# Patient Record
Sex: Male | Born: 1960 | Hispanic: No | Marital: Married | Smoking: Former smoker
Health system: Southern US, Community
[De-identification: ages and names within clinical notes are randomized; demographics above are authoritative.]

## PROBLEM LIST (undated history)

## (undated) DIAGNOSIS — R1310 Dysphagia, unspecified: Secondary | ICD-10-CM

## (undated) DIAGNOSIS — K219 Gastro-esophageal reflux disease without esophagitis: Secondary | ICD-10-CM

## (undated) DIAGNOSIS — T7840XA Allergy, unspecified, initial encounter: Secondary | ICD-10-CM

## (undated) DIAGNOSIS — D751 Secondary polycythemia: Secondary | ICD-10-CM

## (undated) DIAGNOSIS — Z9889 Other specified postprocedural states: Secondary | ICD-10-CM

## (undated) DIAGNOSIS — R112 Nausea with vomiting, unspecified: Secondary | ICD-10-CM

## (undated) DIAGNOSIS — E785 Hyperlipidemia, unspecified: Secondary | ICD-10-CM

## (undated) HISTORY — PX: NOSE SURGERY: SHX723

## (undated) HISTORY — PX: UPPER GASTROINTESTINAL ENDOSCOPY: SHX188

## (undated) HISTORY — DX: Allergy, unspecified, initial encounter: T78.40XA

## (undated) HISTORY — DX: Secondary polycythemia: D75.1

## (undated) HISTORY — PX: TONSILLECTOMY: SUR1361

## (undated) HISTORY — PX: ESOPHAGOGASTRODUODENOSCOPY: SHX1529

## (undated) HISTORY — DX: Hyperlipidemia, unspecified: E78.5

---

## 2006-06-11 ENCOUNTER — Ambulatory Visit (HOSPITAL_BASED_OUTPATIENT_CLINIC_OR_DEPARTMENT_OTHER): Admission: RE | Admit: 2006-06-11 | Discharge: 2006-06-11 | Payer: Self-pay | Admitting: Urology

## 2013-05-25 ENCOUNTER — Other Ambulatory Visit: Payer: Self-pay | Admitting: Internal Medicine

## 2013-05-25 DIAGNOSIS — R1310 Dysphagia, unspecified: Secondary | ICD-10-CM

## 2013-05-30 ENCOUNTER — Inpatient Hospital Stay: Admission: RE | Admit: 2013-05-30 | Payer: Self-pay | Source: Ambulatory Visit

## 2013-06-20 ENCOUNTER — Other Ambulatory Visit (HOSPITAL_COMMUNITY): Payer: Self-pay

## 2013-06-27 ENCOUNTER — Ambulatory Visit
Admission: RE | Admit: 2013-06-27 | Discharge: 2013-06-27 | Disposition: A | Discharge status: Home / Self Care | Payer: BC Managed Care – PPO | Source: Ambulatory Visit | Attending: Internal Medicine | Admitting: Internal Medicine

## 2013-06-27 DIAGNOSIS — R1310 Dysphagia, unspecified: Secondary | ICD-10-CM

## 2013-06-28 ENCOUNTER — Encounter: Payer: Self-pay | Admitting: Gastroenterology

## 2013-08-11 ENCOUNTER — Ambulatory Visit (INDEPENDENT_AMBULATORY_CARE_PROVIDER_SITE_OTHER): Payer: BC Managed Care – PPO | Admitting: Gastroenterology

## 2013-08-11 ENCOUNTER — Encounter: Payer: Self-pay | Admitting: Gastroenterology

## 2013-08-11 VITALS — BP 116/90 | HR 72 | Ht 70.75 in | Wt 210.0 lb

## 2013-08-11 DIAGNOSIS — K222 Esophageal obstruction: Secondary | ICD-10-CM

## 2013-08-11 DIAGNOSIS — Z1211 Encounter for screening for malignant neoplasm of colon: Secondary | ICD-10-CM

## 2013-08-11 MED ORDER — PANTOPRAZOLE SODIUM 40 MG PO TBEC: 40.0000 mg | DELAYED_RELEASE_TABLET | Freq: Every day | ORAL | Status: DC

## 2013-08-11 NOTE — Addendum Note (Signed)
Addended by: Oda Kilts on: 08/11/2013 11:38 AM   Modules accepted: Orders

## 2013-08-11 NOTE — Assessment & Plan Note (Signed)
Patient has a symptomatic stricture as evidenced by barium esophagram.  Recommendations #1 begin Protonix 40 mg daily #2 upper endoscopy with dilation as indicated  Risks, alternatives, and complications of the procedure, including bleeding, perforation, and possible need for surgery, were explained to the patient.  Patient's questions were answered.

## 2013-08-11 NOTE — Assessment & Plan Note (Signed)
She was encouraged to undergo screening colonoscopy

## 2013-08-11 NOTE — Progress Notes (Signed)
    _                                                                                                                History of Present Illness: Less than 53 year old white male referred for evaluation of dysphagia.  Patient has developed dysphagia to solids intermittently.  Recent barium esophagram demonstrated some narrowing at the GE junction and holdup of a barium pill.  He has occasional pyrosis.  Weight has been stable.    History reviewed. No pertinent past medical history. Past Surgical History  Procedure Laterality Date  . Nose surgery    . Tonsillectomy     family history is not on file. He was adopted. Current Outpatient Prescriptions  Medication Sig Dispense Refill  . Calcium Carbonate Antacid (ANTACID PO) Take by mouth as needed.       No current facility-administered medications for this visit.   Allergies as of 08/11/2013 - Review Complete 08/11/2013  Allergen Reaction Noted  . Codeine Other (See Comments) 08/11/2013    reports that he quit smoking about 8 years ago. His smoking use included Cigarettes. He smoked 0.00 packs per day. He quit smokeless tobacco use about 30 years ago. His smokeless tobacco use included Chew. He reports that he drinks alcohol. He reports that he does not use illicit drugs.     Review of Systems: Pertinent positive and negative review of systems were noted in the above HPI section. All other review of systems were otherwise negative.  Vital signs were reviewed in today's medical record Physical Exam: General: Well developed , well nourished, no acute distress Skin: anicteric Head: Normocephalic and atraumatic Eyes:  sclerae anicteric, EOMI Ears: Normal auditory acuity Mouth: No deformity or lesions Neck: Supple, no masses or thyromegaly Lungs: Clear throughout to auscultation Heart: Regular rate and rhythm; no murmurs, rubs or bruits Abdomen: Soft, non tender and non distended. No masses, hepatosplenomegaly or  hernias noted. Normal Bowel sounds Rectal:deferred Musculoskeletal: Symmetrical with no gross deformities  Skin: No lesions on visible extremities Pulses:  Normal pulses noted Extremities: No clubbing, cyanosis, edema or deformities noted Neurological: Alert oriented x 4, grossly nonfocal Cervical Nodes:  No significant cervical adenopathy Inguinal Nodes: No significant inguinal adenopathy Psychological:  Alert and cooperative. Normal mood and affect  See Assessment and Plan under Problem List

## 2013-08-11 NOTE — Patient Instructions (Signed)

## 2013-08-14 ENCOUNTER — Encounter: Payer: Self-pay | Admitting: Gastroenterology

## 2013-08-21 ENCOUNTER — Ambulatory Visit (AMBULATORY_SURGERY_CENTER): Payer: BC Managed Care – PPO | Admitting: Gastroenterology

## 2013-08-21 ENCOUNTER — Encounter: Payer: Self-pay | Admitting: Gastroenterology

## 2013-08-21 VITALS — BP 105/70 | HR 64 | Temp 97.1°F | Resp 11 | Ht 70.0 in | Wt 210.0 lb

## 2013-08-21 DIAGNOSIS — D131 Benign neoplasm of stomach: Secondary | ICD-10-CM

## 2013-08-21 DIAGNOSIS — K222 Esophageal obstruction: Secondary | ICD-10-CM

## 2013-08-21 MED ORDER — SODIUM CHLORIDE 0.9 % IV SOLN: 500.0000 mL | INTRAVENOUS | Status: DC

## 2013-08-21 NOTE — Progress Notes (Signed)
Report to pacu rn, vss, bbs=clear 

## 2013-08-21 NOTE — Progress Notes (Signed)
Patient denies any allergies to eggs or soy. Patient has nausea after surgery. Patient denies any oxygen use at home and does not take any diet/weight loss medications.

## 2013-08-21 NOTE — Progress Notes (Signed)
Per Dr. Deatra Ina he stated that he wanted the EGD in 2 weeks scheduled at  the [facility].

## 2013-08-21 NOTE — Patient Instructions (Signed)
YOU HAD AN ENDOSCOPIC PROCEDURE TODAY AT [facility]: Refer to the procedure report that was given to you for any specific questions about what was found during the examination.  If the procedure report does not answer your questions, please call your gastroenterologist to clarify.  If you requested that your care partner not be given the details of your procedure findings, then the procedure report has been included in a sealed envelope for you to review at your convenience later.  YOU SHOULD EXPECT: Some feelings of bloating in the abdomen. Passage of more gas than usual.  Walking can help get rid of the air that was put into your GI tract during the procedure and reduce the bloating. If you had a lower endoscopy (such as a colonoscopy or flexible sigmoidoscopy) you may notice spotting of blood in your stool or on the toilet paper. If you underwent a bowel prep for your procedure, then you may not have a normal bowel movement for a few days.  DIET: FOLLOW DILATION HANDOUT.  ACTIVITY: Your care partner should take you home directly after the procedure.  You should plan to take it easy, moving slowly for the rest of the day.  You can resume normal activity the day after the procedure however you should NOT DRIVE or use heavy machinery for 24 hours (because of the sedation medicines used during the test).    SYMPTOMS TO REPORT IMMEDIATELY: A gastroenterologist can be reached at any hour.  During normal business hours, 8:30 AM to 5:00 PM Monday through Friday, call [phone].  After hours and on weekends, please call the GI answering service at [phone] who will take a message and have the physician on call contact you.    Following upper endoscopy (EGD)  Vomiting of blood or coffee ground material  New chest pain or pain under the shoulder blades  Painful or persistently difficult swallowing  New shortness of breath  Fever of 100F or higher  Black, tarry-looking  stools  FOLLOW UP: If any biopsies were taken you will be contacted by phone or by letter within the next 1-3 weeks.  Call your gastroenterologist if you have not heard about the biopsies in 3 weeks.  Our staff will call the home number listed on your records the next business day following your procedure to check on you and address any questions or concerns that you may have at that time regarding the information given to you following your procedure. This is a courtesy call and so if there is no answer at the home number and we have not heard from you through the emergency physician on call, we will assume that you have returned to your regular daily activities without incident.  SIGNATURES/CONFIDENTIALITY: You and/or your care partner have signed paperwork which will be entered into your electronic medical record.  These signatures attest to the fact that that the information above on your After Visit Summary has been reviewed and is understood.  Full responsibility of the confidentiality of this discharge information lies with you and/or your care-partner.   Information given stricture and dilation diet with discharge instructions.

## 2013-08-22 ENCOUNTER — Telehealth: Payer: Self-pay

## 2013-08-22 ENCOUNTER — Other Ambulatory Visit: Payer: Self-pay

## 2013-08-22 DIAGNOSIS — R1319 Other dysphagia: Secondary | ICD-10-CM

## 2013-08-22 NOTE — Telephone Encounter (Signed)
Message copied by Algernon Huxley on Tue Aug 22, 2013 11:17 AM ------      Message from: Erskine Emery D      Created: Tue Aug 22, 2013 10:09 AM      Regarding: RE: EGD with balloon dil       Can schedule this sometime during my hospital week in early June      ----- Message -----         From: Maury Dus, RN         Sent: 08/22/2013   9:51 AM           To: Inda Castle, MD      Subject: EGD with balloon dil                                     Dr. Deatra Ina,            You wanted this pt to have an EGD with balloon dil in 2 weeks. The earliest this can be scheduled at [facility] is 10-05-13 if you can do it. You already have a 12:30 EGD with banding scheduled but you can follow that procedure if you want. If this date doesn't work the next available would be 10/16/13.            Can you do another procedure 10/05/13?            Thanks,      Vaughan Basta       ------

## 2013-08-22 NOTE — Telephone Encounter (Signed)
Pt scheduled for EGD with balloon dil/MAC at [facility] 09/19/13. Pt to arrive at [facility] at 11am for a 12:30pm appt.  Instructions mailed to pt and he is aware of appt date and time.

## 2013-08-22 NOTE — Telephone Encounter (Signed)
No answer, left vm 

## 2013-08-28 ENCOUNTER — Encounter (HOSPITAL_COMMUNITY): Payer: Self-pay | Admitting: Pharmacy Technician

## 2013-09-06 ENCOUNTER — Encounter (HOSPITAL_COMMUNITY): Payer: Self-pay | Admitting: *Deleted

## 2013-09-18 ENCOUNTER — Telehealth: Payer: Self-pay | Admitting: Gastroenterology

## 2013-09-18 NOTE — Telephone Encounter (Signed)
Pt states he had a cold last week and that his throat felt stuffy. He lost his voice and it just started coming back. Pt states he has not had a fever but wants to make sure he is ok to proceed with EGD. Please advise.

## 2013-09-18 NOTE — Telephone Encounter (Signed)
Spoke with pt and he is aware. 

## 2013-09-18 NOTE — Telephone Encounter (Signed)
Okay to proceed with procedure

## 2013-09-19 ENCOUNTER — Encounter (HOSPITAL_COMMUNITY): Payer: BC Managed Care – PPO | Admitting: Anesthesiology

## 2013-09-19 ENCOUNTER — Encounter (HOSPITAL_COMMUNITY)
Admission: RE | Disposition: A | Discharge status: Home / Self Care | Payer: Self-pay | Source: Ambulatory Visit | Attending: Gastroenterology

## 2013-09-19 ENCOUNTER — Encounter (HOSPITAL_COMMUNITY): Payer: Self-pay | Admitting: Gastroenterology

## 2013-09-19 ENCOUNTER — Ambulatory Visit (HOSPITAL_COMMUNITY): Payer: BC Managed Care – PPO | Admitting: Anesthesiology

## 2013-09-19 ENCOUNTER — Ambulatory Visit (HOSPITAL_COMMUNITY)
Admission: RE | Admit: 2013-09-19 | Discharge: 2013-09-19 | Disposition: A | Discharge status: Home / Self Care | Payer: BC Managed Care – PPO | Source: Ambulatory Visit | Attending: Gastroenterology | Admitting: Gastroenterology

## 2013-09-19 DIAGNOSIS — R1319 Other dysphagia: Secondary | ICD-10-CM

## 2013-09-19 DIAGNOSIS — K219 Gastro-esophageal reflux disease without esophagitis: Secondary | ICD-10-CM | POA: Insufficient documentation

## 2013-09-19 DIAGNOSIS — Z885 Allergy status to narcotic agent status: Secondary | ICD-10-CM | POA: Insufficient documentation

## 2013-09-19 DIAGNOSIS — D131 Benign neoplasm of stomach: Secondary | ICD-10-CM | POA: Insufficient documentation

## 2013-09-19 DIAGNOSIS — Z87891 Personal history of nicotine dependence: Secondary | ICD-10-CM | POA: Insufficient documentation

## 2013-09-19 DIAGNOSIS — K222 Esophageal obstruction: Secondary | ICD-10-CM | POA: Insufficient documentation

## 2013-09-19 HISTORY — PX: BALLOON DILATION: SHX5330

## 2013-09-19 HISTORY — DX: Other specified postprocedural states: Z98.890

## 2013-09-19 HISTORY — DX: Dysphagia, unspecified: R13.10

## 2013-09-19 HISTORY — DX: Gastro-esophageal reflux disease without esophagitis: K21.9

## 2013-09-19 HISTORY — DX: Other specified postprocedural states: R11.2

## 2013-09-19 HISTORY — PX: ESOPHAGOGASTRODUODENOSCOPY: SHX5428

## 2013-09-19 SURGERY — EGD (ESOPHAGOGASTRODUODENOSCOPY)
Anesthesia: Monitor Anesthesia Care

## 2013-09-19 MED ORDER — BUTAMBEN-TETRACAINE-BENZOCAINE 2-2-14 % EX AERO
INHALATION_SPRAY | CUTANEOUS | Status: DC | PRN
  Administered 2013-09-19: 2 via TOPICAL

## 2013-09-19 MED ORDER — SODIUM CHLORIDE 0.9 % IV SOLN: INTRAVENOUS | Status: DC

## 2013-09-19 MED ORDER — FENTANYL CITRATE 0.05 MG/ML IJ SOLN
INTRAMUSCULAR | Status: AC
  Filled 2013-09-19: qty 2

## 2013-09-19 MED ORDER — MIDAZOLAM HCL 5 MG/5ML IJ SOLN
INTRAMUSCULAR | Status: DC | PRN
  Administered 2013-09-19: 1 mg via INTRAVENOUS

## 2013-09-19 MED ORDER — PROPOFOL 10 MG/ML IV BOLUS
INTRAVENOUS | Status: AC
  Filled 2013-09-19: qty 20

## 2013-09-19 MED ORDER — KETAMINE HCL 10 MG/ML IJ SOLN
INTRAMUSCULAR | Status: AC
  Filled 2013-09-19: qty 1

## 2013-09-19 MED ORDER — LACTATED RINGERS IV SOLN
INTRAVENOUS | Status: DC
  Administered 2013-09-19: 1000 mL via INTRAVENOUS

## 2013-09-19 MED ORDER — FENTANYL CITRATE 0.05 MG/ML IJ SOLN
INTRAMUSCULAR | Status: DC | PRN
  Administered 2013-09-19: 50 ug via INTRAVENOUS

## 2013-09-19 MED ORDER — MIDAZOLAM HCL 2 MG/2ML IJ SOLN
INTRAMUSCULAR | Status: AC
  Filled 2013-09-19: qty 2

## 2013-09-19 MED ORDER — PROPOFOL INFUSION 10 MG/ML OPTIME
INTRAVENOUS | Status: DC | PRN
  Administered 2013-09-19: 250 ug/kg/min via INTRAVENOUS

## 2013-09-19 NOTE — Discharge Instructions (Signed)
Monitored Anesthesia Care  Monitored anesthesia care is an anesthesia service for a medical procedure. Anesthesia is the loss of the ability to feel pain. It is produced by medications called anesthetics. It may affect a small area of your body (local anesthesia), a large area of your body (regional anesthesia), or your entire body (general anesthesia). The need for monitored anesthesia care depends your procedure, your condition, and the potential need for regional or general anesthesia. It is often provided during procedures where:   General anesthesia may be needed if there are complications. This is because you need special care when you are under general anesthesia.   You will be under local or regional anesthesia. This is so that you are able to have higher levels of anesthesia if needed.   You will receive calming medications (sedatives). This is especially the case if sedatives are given to put you in a semi-conscious state of relaxation (deep sedation). This is because the amount of sedative needed to produce this state can be hard to predict. Too much of a sedative can produce general anesthesia. Monitored anesthesia care is performed by one or more caregivers who have special training in all types of anesthesia. You will need to meet with these caregivers before your procedure. During this meeting, they will ask you about your medical history. They will also give you instructions to follow. (For example, you will need to stop eating and drinking before your procedure. You may also need to stop or change medications you are taking.) During your procedure, your caregivers will stay with you. They will:   Watch your condition. This includes watching you blood pressure, breathing, and level of pain.   Diagnose and treat problems that occur.   Give medications if they are needed. These may include calming medications (sedatives) and anesthetics.   Make sure you are comfortable.  Having  monitored anesthesia care does not necessarily mean that you will be under anesthesia. It does mean that your caregivers will be able to manage anesthesia if you need it or if it occurs. It also means that you will be able to have a different type of anesthesia than you are having if you need it. When your procedure is complete, your caregivers will continue to watch your condition. They will make sure any medications wear off before you are allowed to go home.  Document Released: 12/24/2004 Document Revised: 07/25/2012 Document Reviewed: 05/11/2012 [facility] Patient Information 2014 [place], [place]. Colonoscopy A colonoscopy is an exam to look at the entire large intestine (colon). This exam can help find problems such as tumors, polyps, inflammation, and areas of bleeding. The exam takes about 1 hour.  LET [facility] CARE PROVIDER KNOW ABOUT:   Any allergies you have.  All medicines you are taking, including vitamins, herbs, eye drops, creams, and over-the-counter medicines.  Previous problems you or members of your family have had with the use of anesthetics.  Any blood disorders you have.  Previous surgeries you have had.  Medical conditions you have. RISKS AND COMPLICATIONS  Generally, this is a safe procedure. However, as with any procedure, complications can occur. Possible complications include:  Bleeding.  Tearing or rupture of the colon Winferd Wease.  Reaction to medicines given during the exam.  Infection (rare). BEFORE THE PROCEDURE   Ask your health care provider about changing or stopping your regular medicines.  You may be prescribed an oral bowel prep. This involves drinking a large amount of medicated liquid, starting the day before  your procedure. The liquid will cause you to have multiple loose stools until your stool is almost clear or light green. This cleans out your colon in preparation for the procedure.  Do not eat or drink anything else once you have started the  bowel prep, unless your health care provider tells you it is safe to do so.  Arrange for someone to drive you home after the procedure. PROCEDURE   You will be given medicine to help you relax (sedative).  You will lie on your side with your knees bent.  A long, flexible tube with a light and camera on the end (colonoscope) will be inserted through the rectum and into the colon. The camera sends video back to a computer screen as it moves through the colon. The colonoscope also releases carbon dioxide gas to inflate the colon. This helps your health care provider see the area better.  During the exam, your health care provider may take a small tissue sample (biopsy) to be examined under a microscope if any abnormalities are found.  The exam is finished when the entire colon has been viewed. AFTER THE PROCEDURE   Do not drive for 24 hours after the exam.  You may have a small amount of blood in your stool.  You may pass moderate amounts of gas and have mild abdominal cramping or bloating. This is caused by the gas used to inflate your colon during the exam.  Ask when your test results will be ready and how you will get your results. Make sure you get your test results. Document Released: 03/27/2000 Document Revised: 01/18/2013 Document Reviewed: 12/05/2012 [facility] Patient Information 2014 [place].

## 2013-09-19 NOTE — Anesthesia Preprocedure Evaluation (Addendum)
Anesthesia Evaluation  Patient identified by MRN, date of birth, ID band Patient awake    Reviewed: Allergy & Precautions, H&P , NPO status , Patient's Chart, lab work & pertinent test results  History of Anesthesia Complications (+) PONV  Airway Mallampati: II TM Distance: >3 FB Neck ROM: full    Dental no notable dental hx. (+) Teeth Intact, Dental Advisory Given   Pulmonary neg pulmonary ROS, former smoker,  breath sounds clear to auscultation  Pulmonary exam normal       Cardiovascular Exercise Tolerance: Good negative cardio ROS  Rhythm:regular Rate:Normal     Neuro/Psych negative neurological ROS  negative psych ROS   GI/Hepatic negative GI ROS, Neg liver ROS, GERD-  Medicated and Controlled,dysphagia   Endo/Other  negative endocrine ROS  Renal/GU negative Renal ROS  negative genitourinary   Musculoskeletal   Abdominal   Peds  Hematology negative hematology ROS (+)   Anesthesia Other Findings   Reproductive/Obstetrics negative OB ROS                          Anesthesia Physical Anesthesia Plan  ASA: II  Anesthesia Plan: MAC   Post-op Pain Management:    Induction:   Airway Management Planned:   Additional Equipment:   Intra-op Plan:   Post-operative Plan:   Informed Consent: I have reviewed the patients History and Physical, chart, labs and discussed the procedure including the risks, benefits and alternatives for the proposed anesthesia with the patient or authorized representative who has indicated his/her understanding and acceptance.   Dental Advisory Given  Plan Discussed with: CRNA and Surgeon  Anesthesia Plan Comments:         Anesthesia Quick Evaluation

## 2013-09-19 NOTE — H&P (Signed)
_                                                                                                                    History of Present Illness:  53 year old white male referred for evaluation of dysphagia.  Patient has developed dysphagia to solids intermittently.  Recent barium esophagram demonstrated some narrowing at the GE junction and holdup of a barium pill.  He has occasional pyrosis.  Weight has been stable.       History reviewed. No pertinent past medical history. Past Surgical History   Procedure  Laterality  Date   .  Nose surgery       .  Tonsillectomy          family history is not on file. He was adopted. Current Outpatient Prescriptions   Medication  Sig  Dispense  Refill   .  Calcium Carbonate Antacid (ANTACID PO)  Take by mouth as needed.             No current facility-administered medications for this visit.       Allergies as of 08/11/2013 - Review Complete 08/11/2013   Allergen  Reaction  Noted   .  Codeine  Other (See Comments)  08/11/2013       reports that he quit smoking about 8 years ago. His smoking use included Cigarettes. He smoked 0.00 packs per day. He quit smokeless tobacco use about 30 years ago. His smokeless tobacco use included Chew. He reports that he drinks alcohol. He reports that he does not use illicit drugs.         Review of Systems: Pertinent positive and negative review of systems were noted in the above HPI section. All other review of systems were otherwise negative.   Vital signs were reviewed in today's medical record Physical Exam: General: Well developed , well nourished, no acute distress Skin: anicteric Head: Normocephalic and atraumatic Eyes:  sclerae anicteric, EOMI Ears: Normal auditory acuity Mouth: No deformity or lesions Neck: Supple, no masses or thyromegaly Lungs: Clear throughout to auscultation Heart: Regular rate and rhythm; no murmurs, rubs or bruits Abdomen: Soft,  non tender and non distended. No masses, hepatosplenomegaly or hernias noted. Normal Bowel sounds Rectal:deferred Musculoskeletal: Symmetrical with no gross deformities   Skin: No lesions on visible extremities Pulses:  Normal pulses noted Extremities: No clubbing, cyanosis, edema or deformities noted Neurological: Alert oriented x 4, grossly nonfocal Cervical Nodes:  No significant cervical adenopathy Inguinal Nodes: No significant inguinal adenopathy Psychological:  Alert and cooperative. Normal mood and affect   See Assessment and Plan under Problem List                      Stricture and stenosis of esophagus - Inda Castle, MD at 08/11/2013 11:32 AM      Status: Written Related Problem: Stricture and stenosis of esophagus  Patient has a symptomatic stricture as evidenced by barium esophagram.   Recommendations #1 begin Protonix 40 mg daily #2 upper endoscopy with dilation as indicated   Risks, alternatives, and complications of the procedure, including bleeding, perforation, and possible need for surgery, were explained to the patient.  Patient's questions were answered.

## 2013-09-19 NOTE — Anesthesia Postprocedure Evaluation (Signed)
  Anesthesia Post-op Note  Patient: Leonard Caldwell  Procedure(s) Performed: Procedure(s) (LRB): ESOPHAGOGASTRODUODENOSCOPY (EGD) (N/A) BALLOON DILATION (N/A)  Patient Location: PACU  Anesthesia Type: MAC  Level of Consciousness: awake and alert   Airway and Oxygen Therapy: Patient Spontanous Breathing  Post-op Pain: mild  Post-op Assessment: Post-op Vital signs reviewed, Patient's Cardiovascular Status Stable, Respiratory Function Stable, Patent Airway and No signs of Nausea or vomiting  Last Vitals:  Filed Vitals:   09/19/13 1227  BP: 106/63  Pulse:   Temp:   Resp: 16    Post-op Vital Signs: stable   Complications: No apparent anesthesia complications

## 2013-09-19 NOTE — Op Note (Signed)
[facility] [place], [address]   ENDOSCOPY PROCEDURE REPORT  PATIENT: Leonard Caldwell, Leonard Caldwell  MR#: [id number] BIRTHDATE: 08-06-1960 , 52  yrs. old GENDER: Male ENDOSCOPIST: Inda Castle, MD ASSISTANT:   Sharon Mt, Endo Technician Hilma Favors, RN REFERRED ME:QASTMH Philip Aspen, M.D. PROCEDURE DATE:  09/19/2013 PROCEDURE:   EGD with balloon dilatation ASA CLASS:   Class II INDICATIONS:dysphagia.   barium swallow demonstrated an early distal esophageal stricture MEDICATIONS: MAC sedation, administered by CRNA TOPICAL ANESTHETIC:  DESCRIPTION OF PROCEDURE:   After the risks benefits and alternatives of the procedure were thoroughly explained, informed consent was obtained.  The     endoscope was introduced through the mouth  and advanced to the second portion of the duodenum , The instrument was slowly withdrawn as the mucosa was carefully examined.    At the GE junction there was very slight narrowing.  The 9 mm gastroscope easily traversed the stricture.  In the gastric fundus there was a 2 mm sessile polyp with normal appearing mucosa consistent with a gastric fundic polyp The remainder of the upper endoscopy exam was otherwise normal.     Dilation was then performed at the gastroesphageal junction  Dilator:Balloon Size:15-16.5-18mm  Reststance:moderate to the 18 mm dilator.  Heme:none  COMPLICATIONS: There were no complications. ENDOSCOPIC IMPRESSION: 1.   Early esophageal stricture-status post balloon dilation 2.  fundic gland polyp  RECOMMENDATIONS: repeat dilatation as needed  eSigned:  Inda Castle, MD 09/19/2013 12:09 PM  CC:

## 2013-09-19 NOTE — Transfer of Care (Signed)
Immediate Anesthesia Transfer of Care Note  Patient: Leonard Caldwell  Procedure(s) Performed: Procedure(s): ESOPHAGOGASTRODUODENOSCOPY (EGD) (N/A) BALLOON DILATION (N/A)  Patient Location: PACU  Anesthesia Type:MAC  Level of Consciousness: awake, alert , oriented and patient cooperative  Airway & Oxygen Therapy: Patient Spontanous Breathing and Patient connected to nasal cannula oxygen  Post-op Assessment: Report given to PACU RN, Post -op Vital signs reviewed and stable and Patient moving all extremities X 4  Post vital signs: stable  Complications: No apparent anesthesia complications

## 2013-09-20 ENCOUNTER — Encounter (HOSPITAL_COMMUNITY): Payer: Self-pay | Admitting: Gastroenterology

## 2014-05-28 ENCOUNTER — Encounter: Payer: Self-pay | Admitting: Gastroenterology

## 2014-07-16 ENCOUNTER — Ambulatory Visit (AMBULATORY_SURGERY_CENTER): Payer: Self-pay | Admitting: *Deleted

## 2014-07-16 VITALS — Ht 72.0 in | Wt 216.4 lb

## 2014-07-16 DIAGNOSIS — Z1211 Encounter for screening for malignant neoplasm of colon: Secondary | ICD-10-CM

## 2014-07-16 MED ORDER — NA SULFATE-K SULFATE-MG SULF 17.5-3.13-1.6 GM/177ML PO SOLN: 1.0000 | Freq: Once | ORAL | Status: DC

## 2014-07-16 NOTE — Progress Notes (Signed)
No egg or soy allergy Propofol last year was no issues  No diet pills No home 02 use  Post op N/V but no other issues  emmi declined

## 2014-07-27 ENCOUNTER — Encounter: Payer: Self-pay | Admitting: Gastroenterology

## 2014-07-30 ENCOUNTER — Encounter: Payer: Self-pay | Admitting: Gastroenterology

## 2014-07-30 ENCOUNTER — Ambulatory Visit (AMBULATORY_SURGERY_CENTER): Payer: BLUE CROSS/BLUE SHIELD | Admitting: Gastroenterology

## 2014-07-30 VITALS — BP 109/70 | HR 79 | Temp 98.4°F | Resp 19 | Ht 72.0 in | Wt 216.0 lb

## 2014-07-30 DIAGNOSIS — K5730 Diverticulosis of large intestine without perforation or abscess without bleeding: Secondary | ICD-10-CM

## 2014-07-30 DIAGNOSIS — Z1211 Encounter for screening for malignant neoplasm of colon: Secondary | ICD-10-CM | POA: Diagnosis not present

## 2014-07-30 MED ORDER — SODIUM CHLORIDE 0.9 % IV SOLN: 500.0000 mL | INTRAVENOUS | Status: DC

## 2014-07-30 MED ORDER — PANTOPRAZOLE SODIUM 40 MG PO TBEC: 40.0000 mg | DELAYED_RELEASE_TABLET | Freq: Every day | ORAL | Status: DC

## 2014-07-30 NOTE — Op Note (Signed)
[facility]  [address] [place], [address]   COLONOSCOPY PROCEDURE REPORT  PATIENT: Leonard, Caldwell  MR#: [id number] BIRTHDATE: Jul 28, 1960 , 53  yrs. old GENDER: male ENDOSCOPIST: Inda Castle, MD REFERRED ZG:YFVCBS Philip Aspen, M.D. PROCEDURE DATE:  07/30/2014 PROCEDURE:   Colonoscopy, screening First Screening Colonoscopy - Avg.  risk and is 50 yrs.  old or older Yes.  Prior Negative Screening - Now for repeat screening. N/A  History of Adenoma - Now for follow-up colonoscopy & has been > or = to 3 yrs.  N/A ASA CLASS:   Class II INDICATIONS:Colorectal Neoplasm Risk Assessment for this procedure is average risk. MEDICATIONS: Monitored anesthesia care and Propofol 200 mg IV  DESCRIPTION OF PROCEDURE:   After the risks benefits and alternatives of the procedure were thoroughly explained, informed consent was obtained.  The digital rectal exam revealed no abnormalities of the rectum.   The LB [id number]  endoscope was introduced through the anus and advanced to the cecum, which was identified by both the appendix and ileocecal valve. No adverse events experienced.   The quality of the prep was (Suprep was used) excellent.  The instrument was then slowly withdrawn as the colon was fully examined.      COLON FINDINGS: There was mild diverticulosis noted in the sigmoid colon.   The examination was otherwise normal.  Retroflexed views revealed no abnormalities. The time to cecum = 3.6 Withdrawal time = 6.5   The scope was withdrawn and the procedure completed. COMPLICATIONS: There were no immediate complications.  ENDOSCOPIC IMPRESSION: 1.   Mild diverticulosis was noted in the sigmoid colon 2.   The examination was otherwise normal  RECOMMENDATIONS: Continue current colorectal screening recommendations for "routine risk" patients with a repeat colonoscopy in 10 years.  eSigned:  Inda Castle, MD 07/30/2014 10:46 AM   cc:

## 2014-07-30 NOTE — Progress Notes (Signed)
Report to PACU, RN, vss, BBS= Clear.  

## 2014-07-30 NOTE — Patient Instructions (Signed)
YOU HAD AN ENDOSCOPIC PROCEDURE TODAY AT THE [facility]:   Refer to the procedure report that was given to you for any specific questions about what was found during the examination.  If the procedure report does not answer your questions, please call your gastroenterologist to clarify.  If you requested that your care partner not be given the details of your procedure findings, then the procedure report has been included in a sealed envelope for you to review at your convenience later.  YOU SHOULD EXPECT: Some feelings of bloating in the abdomen. Passage of more gas than usual.  Walking can help get rid of the air that was put into your GI tract during the procedure and reduce the bloating. If you had a lower endoscopy (such as a colonoscopy or flexible sigmoidoscopy) you may notice spotting of blood in your stool or on the toilet paper. If you underwent a bowel prep for your procedure, you may not have a normal bowel movement for a few days.  Please Note:  You might notice some irritation and congestion in your nose or some drainage.  This is from the oxygen used during your procedure.  There is no need for concern and it should clear up in a day or so.  SYMPTOMS TO REPORT IMMEDIATELY:   Following lower endoscopy (colonoscopy or flexible sigmoidoscopy):  Excessive amounts of blood in the stool  Significant tenderness or worsening of abdominal pains  Swelling of the abdomen that is new, acute  Fever of 100F or higher   For urgent or emergent issues, a gastroenterologist can be reached at any hour by calling [phone].   DIET: Your first meal following the procedure should be a small meal and then it is ok to progress to your normal diet. Heavy or fried foods are harder to digest and may make you feel nauseous or bloated.  Likewise, meals heavy in dairy and vegetables can increase bloating.  Drink plenty of fluids but you should avoid alcoholic beverages for 24  hours.  ACTIVITY:  You should plan to take it easy for the rest of today and you should NOT DRIVE or use heavy machinery until tomorrow (because of the sedation medicines used during the test).    FOLLOW UP: Our staff will call the number listed on your records the next business day following your procedure to check on you and address any questions or concerns that you may have regarding the information given to you following your procedure. If we do not reach you, we will leave a message.  However, if you are feeling well and you are not experiencing any problems, there is no need to return our call.  We will assume that you have returned to your regular daily activities without incident.  If any biopsies were taken you will be contacted by phone or by letter within the next 1-3 weeks.  Please call us at [phone] if you have not heard about the biopsies in 3 weeks.    SIGNATURES/CONFIDENTIALITY: You and/or your care partner have signed paperwork which will be entered into your electronic medical record.  These signatures attest to the fact that that the information above on your After Visit Summary has been reviewed and is understood.  Full responsibility of the confidentiality of this discharge information lies with you and/or your care-partner. 

## 2014-07-31 ENCOUNTER — Telehealth: Payer: Self-pay | Admitting: *Deleted

## 2014-07-31 ENCOUNTER — Telehealth: Payer: Self-pay | Admitting: Gastroenterology

## 2014-07-31 NOTE — Telephone Encounter (Signed)
No answer left message to call if questions or concerns. 

## 2014-08-01 ENCOUNTER — Other Ambulatory Visit: Payer: Self-pay

## 2014-08-01 MED ORDER — LANSOPRAZOLE 30 MG PO CPDR: DELAYED_RELEASE_CAPSULE | ORAL | Status: AC

## 2014-08-01 NOTE — Telephone Encounter (Signed)
Patient has been on Protonix for a while. He feels bloated and he belches a lot. He remembers discussing this before his procedure. He thought the PPI was going to be changed. Please advise.

## 2014-08-01 NOTE — Telephone Encounter (Signed)
Please prescribe Prevacid 30 mg daily to be taken before breakfast

## 2014-08-01 NOTE — Telephone Encounter (Signed)
Patient instructed on new medication Prevacid and how to take it. He says he "will have to take it in the middle of the night" because he eats when he gets up in the morning. Suggested having Prevacid at his bedside to take when he awakened. Rx to [facility] per patient request.

## 2014-08-07 ENCOUNTER — Other Ambulatory Visit: Payer: Self-pay | Admitting: Gastroenterology

## 2015-02-01 ENCOUNTER — Other Ambulatory Visit: Payer: Self-pay | Admitting: Occupational Medicine

## 2015-02-01 ENCOUNTER — Ambulatory Visit: Payer: Self-pay

## 2015-02-01 DIAGNOSIS — M25561 Pain in right knee: Secondary | ICD-10-CM

## 2015-07-23 DIAGNOSIS — E298 Other testicular dysfunction: Secondary | ICD-10-CM | POA: Diagnosis not present

## 2015-08-14 DIAGNOSIS — E298 Other testicular dysfunction: Secondary | ICD-10-CM | POA: Diagnosis not present

## 2015-09-03 DIAGNOSIS — E298 Other testicular dysfunction: Secondary | ICD-10-CM | POA: Diagnosis not present

## 2015-09-25 DIAGNOSIS — E298 Other testicular dysfunction: Secondary | ICD-10-CM | POA: Diagnosis not present

## 2015-09-29 ENCOUNTER — Other Ambulatory Visit: Payer: Self-pay | Admitting: Gastroenterology

## 2015-09-30 ENCOUNTER — Other Ambulatory Visit: Payer: Self-pay | Admitting: Gastroenterology

## 2015-10-22 DIAGNOSIS — E298 Other testicular dysfunction: Secondary | ICD-10-CM | POA: Diagnosis not present

## 2015-11-13 DIAGNOSIS — E298 Other testicular dysfunction: Secondary | ICD-10-CM | POA: Diagnosis not present

## 2015-12-04 DIAGNOSIS — E298 Other testicular dysfunction: Secondary | ICD-10-CM | POA: Diagnosis not present

## 2015-12-24 DIAGNOSIS — E298 Other testicular dysfunction: Secondary | ICD-10-CM | POA: Diagnosis not present

## 2016-01-14 DIAGNOSIS — E298 Other testicular dysfunction: Secondary | ICD-10-CM | POA: Diagnosis not present

## 2016-01-31 DIAGNOSIS — Z23 Encounter for immunization: Secondary | ICD-10-CM | POA: Diagnosis not present

## 2016-02-06 DIAGNOSIS — E298 Other testicular dysfunction: Secondary | ICD-10-CM | POA: Diagnosis not present

## 2016-02-25 DIAGNOSIS — E298 Other testicular dysfunction: Secondary | ICD-10-CM | POA: Diagnosis not present

## 2016-03-17 DIAGNOSIS — E298 Other testicular dysfunction: Secondary | ICD-10-CM | POA: Diagnosis not present

## 2016-04-09 DIAGNOSIS — E298 Other testicular dysfunction: Secondary | ICD-10-CM | POA: Diagnosis not present

## 2016-04-28 DIAGNOSIS — E298 Other testicular dysfunction: Secondary | ICD-10-CM | POA: Diagnosis not present

## 2016-05-11 DIAGNOSIS — M9901 Segmental and somatic dysfunction of cervical region: Secondary | ICD-10-CM | POA: Diagnosis not present

## 2016-05-11 DIAGNOSIS — M5432 Sciatica, left side: Secondary | ICD-10-CM | POA: Diagnosis not present

## 2016-05-12 DIAGNOSIS — M9901 Segmental and somatic dysfunction of cervical region: Secondary | ICD-10-CM | POA: Diagnosis not present

## 2016-05-12 DIAGNOSIS — M5432 Sciatica, left side: Secondary | ICD-10-CM | POA: Diagnosis not present

## 2016-05-14 DIAGNOSIS — M9901 Segmental and somatic dysfunction of cervical region: Secondary | ICD-10-CM | POA: Diagnosis not present

## 2016-05-14 DIAGNOSIS — M5432 Sciatica, left side: Secondary | ICD-10-CM | POA: Diagnosis not present

## 2016-05-18 DIAGNOSIS — M5432 Sciatica, left side: Secondary | ICD-10-CM | POA: Diagnosis not present

## 2016-05-18 DIAGNOSIS — M9901 Segmental and somatic dysfunction of cervical region: Secondary | ICD-10-CM | POA: Diagnosis not present

## 2016-05-19 DIAGNOSIS — E298 Other testicular dysfunction: Secondary | ICD-10-CM | POA: Diagnosis not present

## 2016-05-20 DIAGNOSIS — M5432 Sciatica, left side: Secondary | ICD-10-CM | POA: Diagnosis not present

## 2016-05-20 DIAGNOSIS — M9901 Segmental and somatic dysfunction of cervical region: Secondary | ICD-10-CM | POA: Diagnosis not present

## 2016-05-25 DIAGNOSIS — M9901 Segmental and somatic dysfunction of cervical region: Secondary | ICD-10-CM | POA: Diagnosis not present

## 2016-05-25 DIAGNOSIS — M5432 Sciatica, left side: Secondary | ICD-10-CM | POA: Diagnosis not present

## 2016-05-27 DIAGNOSIS — M9901 Segmental and somatic dysfunction of cervical region: Secondary | ICD-10-CM | POA: Diagnosis not present

## 2016-05-27 DIAGNOSIS — M5432 Sciatica, left side: Secondary | ICD-10-CM | POA: Diagnosis not present

## 2016-05-31 IMAGING — CR DG KNEE COMPLETE 4+V*R*
4 series · 4 of 4 positions shown · non-contrast
Comparison: None.

CLINICAL DATA: Twisting injury of the right knee.  Medial pain.

EXAM:
RIGHT KNEE - COMPLETE 4+ VIEW

[view not recorded (1 of 4)]
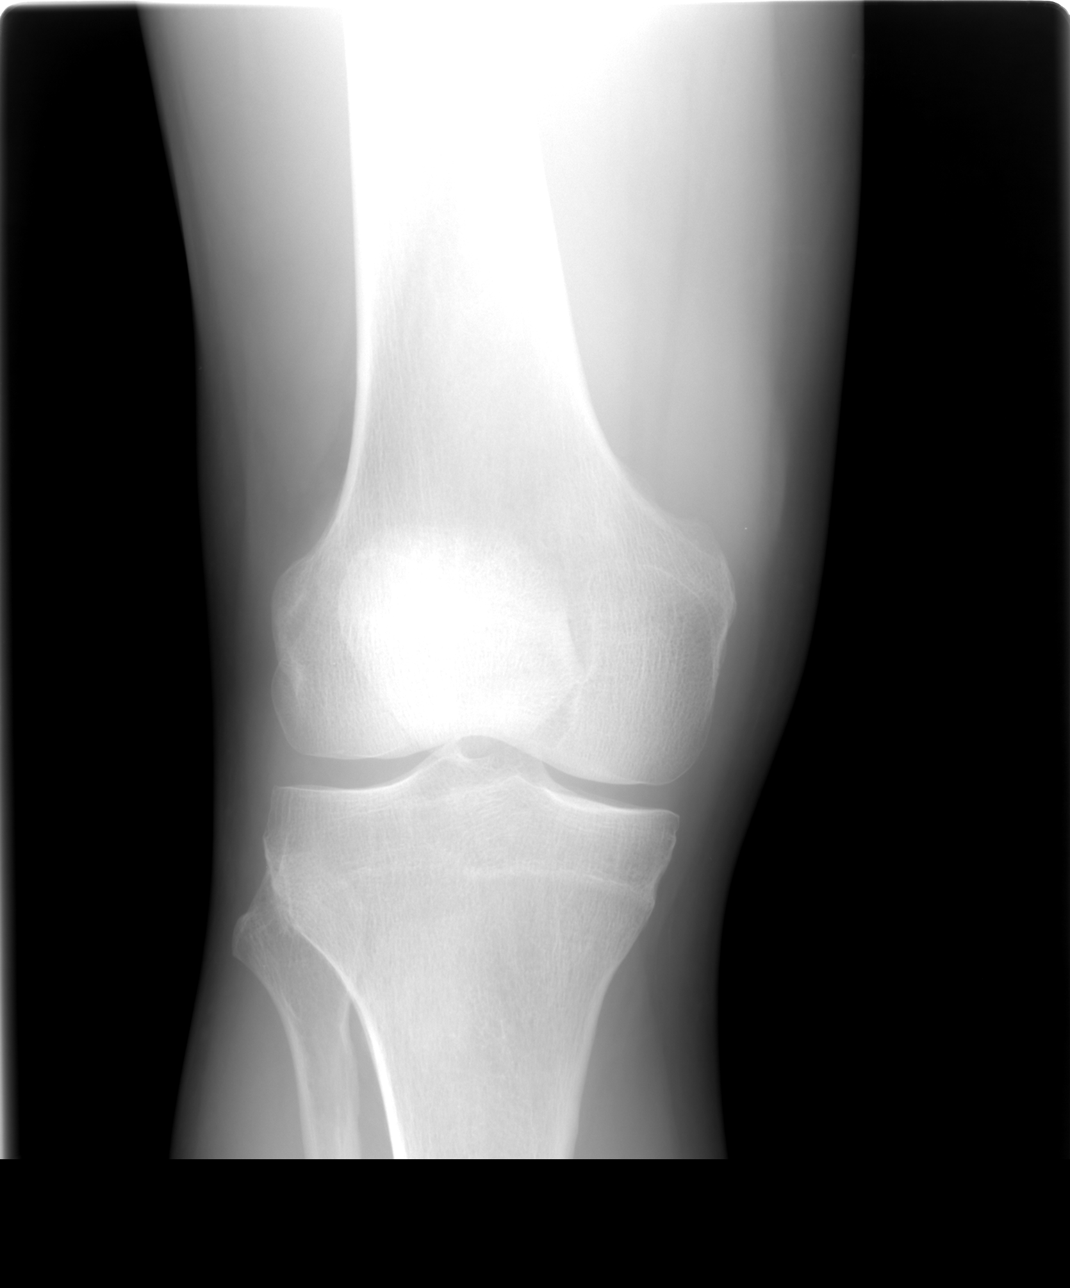

[view not recorded (2 of 4)]
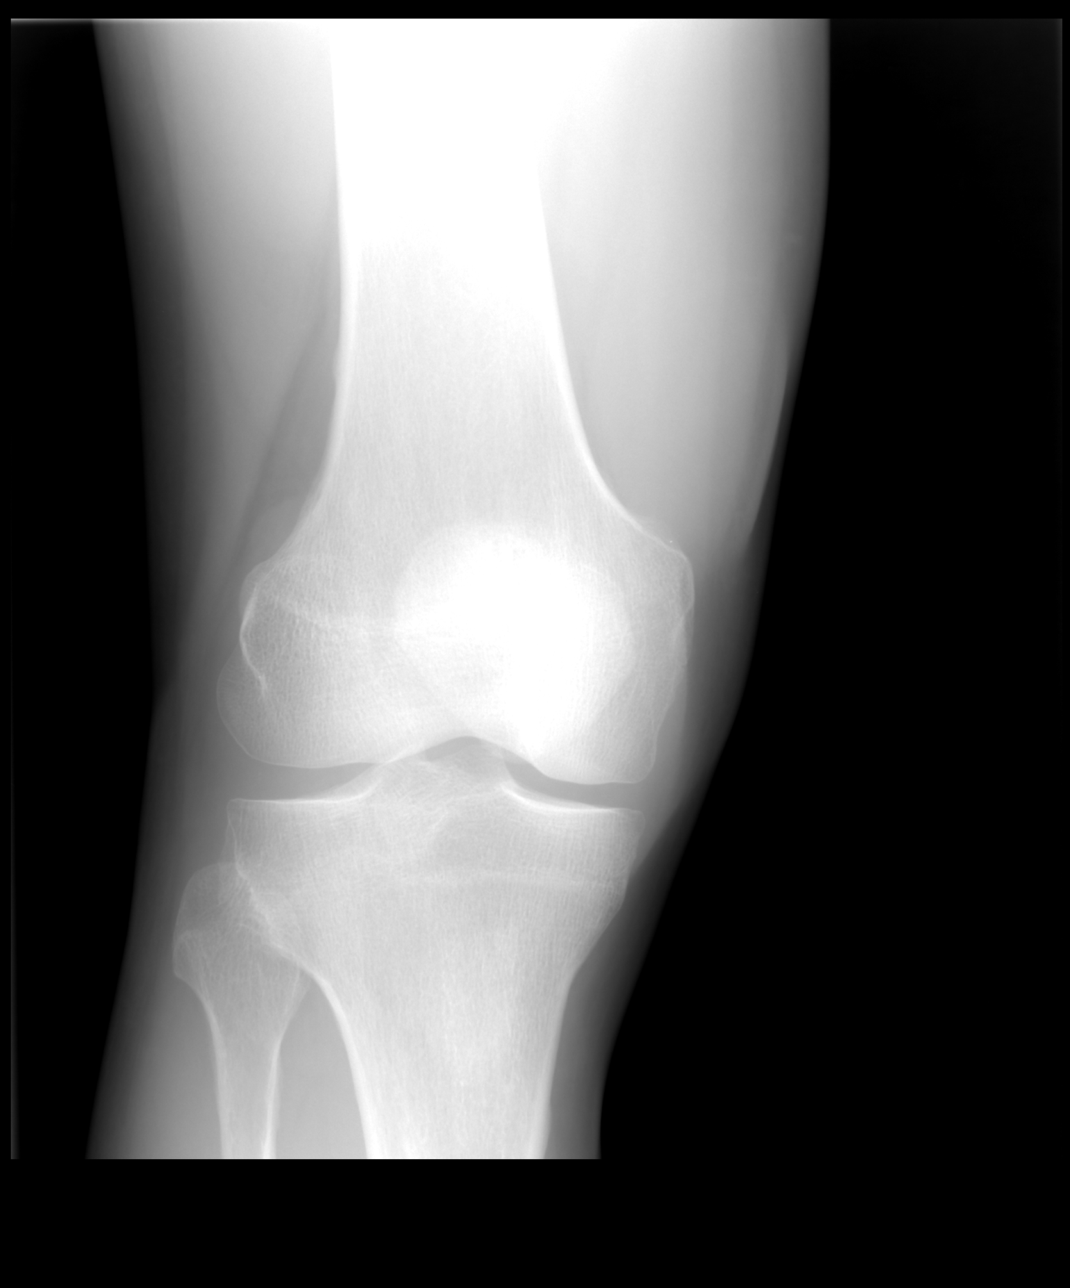

[view not recorded (3 of 4)]
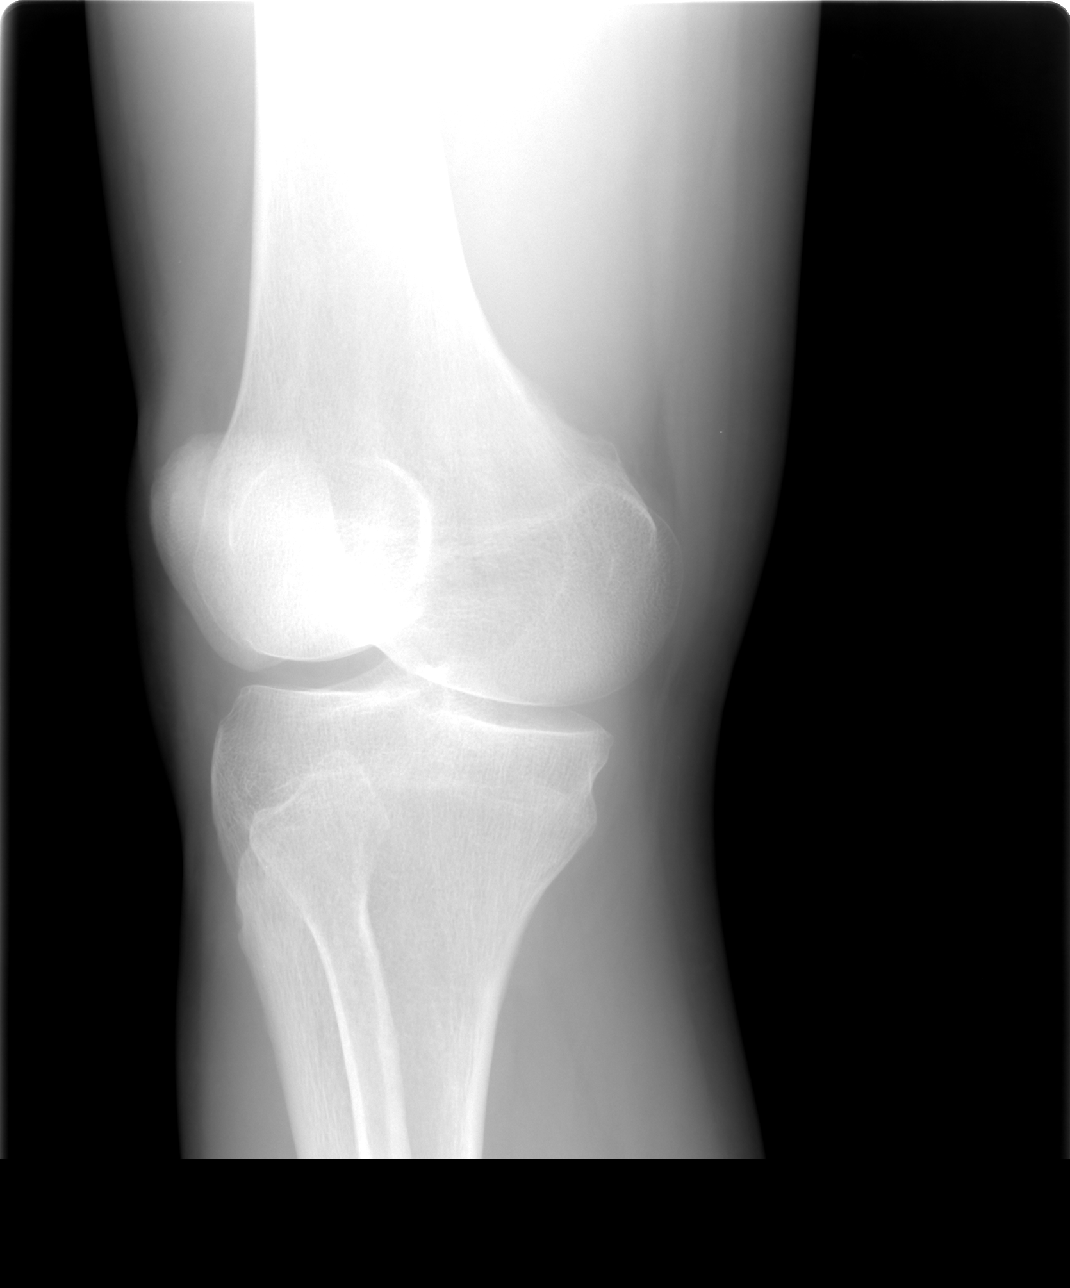

[view not recorded (4 of 4)]
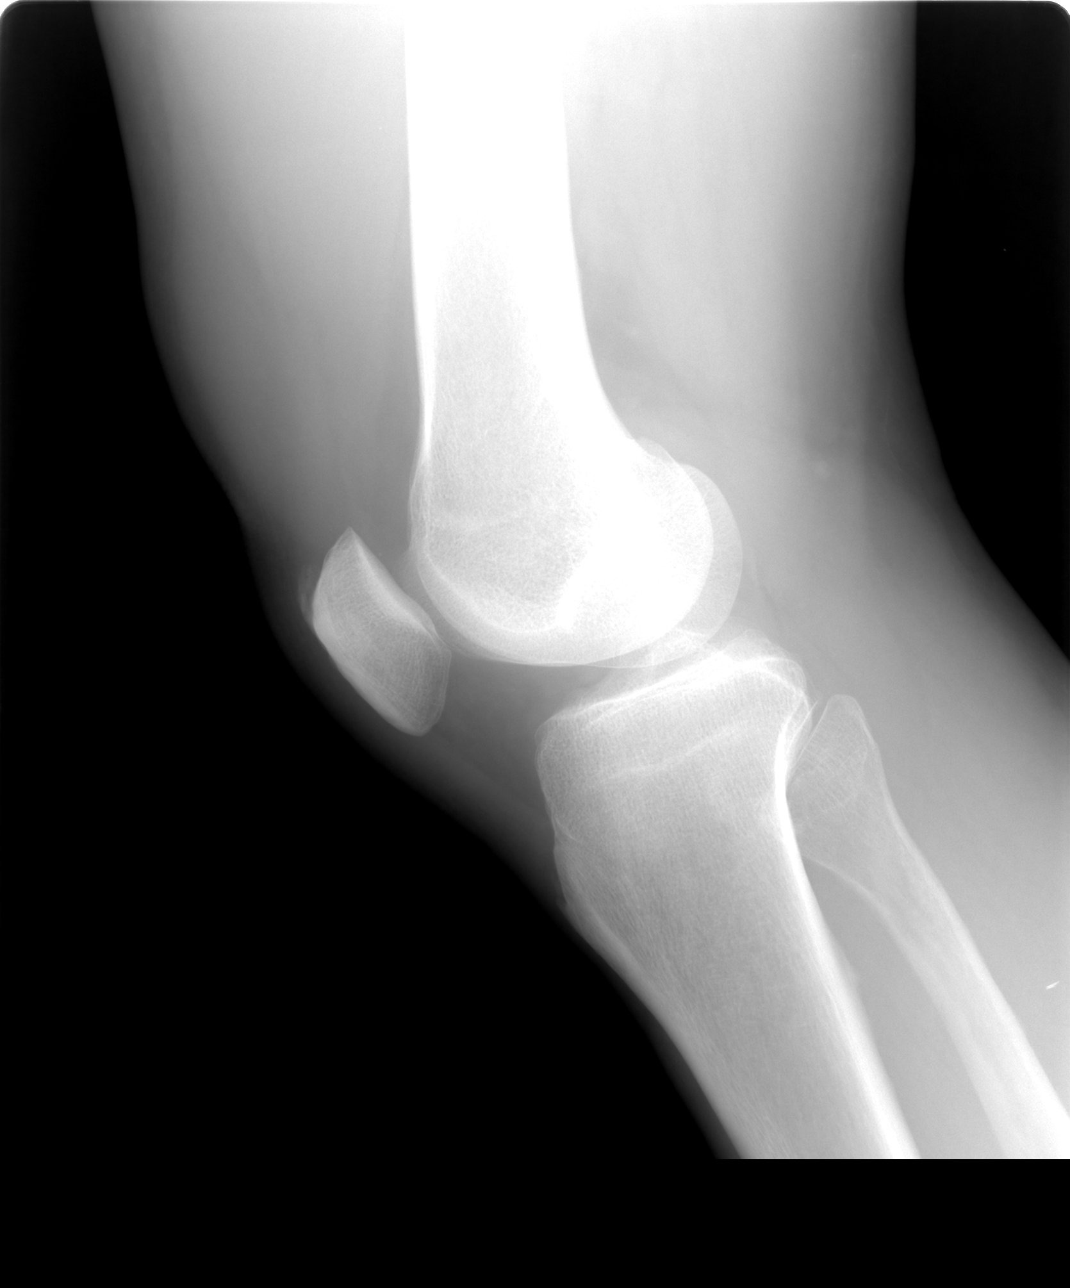

[4 of 4 positions shown; findings below may reference images not displayed]

FINDINGS: There is no evidence of fracture, dislocation, or joint effusion.
There is no evidence of arthropathy or other focal bone abnormality.
Soft tissues are unremarkable.
IMPRESSION: No acute osseous injury of the right knee.

## 2016-06-01 DIAGNOSIS — E785 Hyperlipidemia, unspecified: Secondary | ICD-10-CM | POA: Diagnosis not present

## 2016-06-01 DIAGNOSIS — Z0000 Encounter for general adult medical examination without abnormal findings: Secondary | ICD-10-CM | POA: Diagnosis not present

## 2016-06-01 DIAGNOSIS — M9901 Segmental and somatic dysfunction of cervical region: Secondary | ICD-10-CM | POA: Diagnosis not present

## 2016-06-01 DIAGNOSIS — Z125 Encounter for screening for malignant neoplasm of prostate: Secondary | ICD-10-CM | POA: Diagnosis not present

## 2016-06-01 DIAGNOSIS — E784 Other hyperlipidemia: Secondary | ICD-10-CM | POA: Diagnosis not present

## 2016-06-01 DIAGNOSIS — M5432 Sciatica, left side: Secondary | ICD-10-CM | POA: Diagnosis not present

## 2016-06-04 DIAGNOSIS — M5432 Sciatica, left side: Secondary | ICD-10-CM | POA: Diagnosis not present

## 2016-06-04 DIAGNOSIS — M9901 Segmental and somatic dysfunction of cervical region: Secondary | ICD-10-CM | POA: Diagnosis not present

## 2016-06-08 DIAGNOSIS — Z0000 Encounter for general adult medical examination without abnormal findings: Secondary | ICD-10-CM | POA: Diagnosis not present

## 2016-06-08 DIAGNOSIS — M5432 Sciatica, left side: Secondary | ICD-10-CM | POA: Diagnosis not present

## 2016-06-08 DIAGNOSIS — N528 Other male erectile dysfunction: Secondary | ICD-10-CM | POA: Diagnosis not present

## 2016-06-08 DIAGNOSIS — E298 Other testicular dysfunction: Secondary | ICD-10-CM | POA: Diagnosis not present

## 2016-06-08 DIAGNOSIS — R0683 Snoring: Secondary | ICD-10-CM | POA: Diagnosis not present

## 2016-06-08 DIAGNOSIS — Z1389 Encounter for screening for other disorder: Secondary | ICD-10-CM | POA: Diagnosis not present

## 2016-06-08 DIAGNOSIS — M9901 Segmental and somatic dysfunction of cervical region: Secondary | ICD-10-CM | POA: Diagnosis not present

## 2016-06-08 DIAGNOSIS — M545 Low back pain: Secondary | ICD-10-CM | POA: Diagnosis not present

## 2016-06-09 DIAGNOSIS — E298 Other testicular dysfunction: Secondary | ICD-10-CM | POA: Diagnosis not present

## 2016-06-10 DIAGNOSIS — Z1212 Encounter for screening for malignant neoplasm of rectum: Secondary | ICD-10-CM | POA: Diagnosis not present

## 2016-06-11 DIAGNOSIS — M5432 Sciatica, left side: Secondary | ICD-10-CM | POA: Diagnosis not present

## 2016-06-11 DIAGNOSIS — M9901 Segmental and somatic dysfunction of cervical region: Secondary | ICD-10-CM | POA: Diagnosis not present

## 2016-06-15 DIAGNOSIS — M5432 Sciatica, left side: Secondary | ICD-10-CM | POA: Diagnosis not present

## 2016-06-15 DIAGNOSIS — M9901 Segmental and somatic dysfunction of cervical region: Secondary | ICD-10-CM | POA: Diagnosis not present

## 2016-06-18 DIAGNOSIS — M5432 Sciatica, left side: Secondary | ICD-10-CM | POA: Diagnosis not present

## 2016-06-18 DIAGNOSIS — M9901 Segmental and somatic dysfunction of cervical region: Secondary | ICD-10-CM | POA: Diagnosis not present

## 2016-06-25 DIAGNOSIS — M9901 Segmental and somatic dysfunction of cervical region: Secondary | ICD-10-CM | POA: Diagnosis not present

## 2016-06-25 DIAGNOSIS — M5432 Sciatica, left side: Secondary | ICD-10-CM | POA: Diagnosis not present

## 2016-06-30 DIAGNOSIS — E298 Other testicular dysfunction: Secondary | ICD-10-CM | POA: Diagnosis not present

## 2016-07-02 DIAGNOSIS — M5432 Sciatica, left side: Secondary | ICD-10-CM | POA: Diagnosis not present

## 2016-07-02 DIAGNOSIS — M9901 Segmental and somatic dysfunction of cervical region: Secondary | ICD-10-CM | POA: Diagnosis not present

## 2016-07-23 DIAGNOSIS — E298 Other testicular dysfunction: Secondary | ICD-10-CM | POA: Diagnosis not present

## 2016-07-30 DIAGNOSIS — M9901 Segmental and somatic dysfunction of cervical region: Secondary | ICD-10-CM | POA: Diagnosis not present

## 2016-07-30 DIAGNOSIS — M5432 Sciatica, left side: Secondary | ICD-10-CM | POA: Diagnosis not present

## 2016-08-12 DIAGNOSIS — E298 Other testicular dysfunction: Secondary | ICD-10-CM | POA: Diagnosis not present

## 2016-09-02 DIAGNOSIS — E298 Other testicular dysfunction: Secondary | ICD-10-CM | POA: Diagnosis not present

## 2016-09-29 DIAGNOSIS — E298 Other testicular dysfunction: Secondary | ICD-10-CM | POA: Diagnosis not present

## 2016-10-01 DIAGNOSIS — M5432 Sciatica, left side: Secondary | ICD-10-CM | POA: Diagnosis not present

## 2016-10-01 DIAGNOSIS — M9901 Segmental and somatic dysfunction of cervical region: Secondary | ICD-10-CM | POA: Diagnosis not present

## 2016-10-20 DIAGNOSIS — E298 Other testicular dysfunction: Secondary | ICD-10-CM | POA: Diagnosis not present

## 2016-11-10 DIAGNOSIS — E298 Other testicular dysfunction: Secondary | ICD-10-CM | POA: Diagnosis not present

## 2016-12-02 DIAGNOSIS — E298 Other testicular dysfunction: Secondary | ICD-10-CM | POA: Diagnosis not present

## 2016-12-30 DIAGNOSIS — E298 Other testicular dysfunction: Secondary | ICD-10-CM | POA: Diagnosis not present

## 2017-02-02 DIAGNOSIS — E298 Other testicular dysfunction: Secondary | ICD-10-CM | POA: Diagnosis not present

## 2017-02-23 DIAGNOSIS — E298 Other testicular dysfunction: Secondary | ICD-10-CM | POA: Diagnosis not present

## 2017-03-18 DIAGNOSIS — E298 Other testicular dysfunction: Secondary | ICD-10-CM | POA: Diagnosis not present

## 2017-04-08 DIAGNOSIS — E298 Other testicular dysfunction: Secondary | ICD-10-CM | POA: Diagnosis not present

## 2017-04-28 DIAGNOSIS — E298 Other testicular dysfunction: Secondary | ICD-10-CM | POA: Diagnosis not present

## 2017-05-18 DIAGNOSIS — E298 Other testicular dysfunction: Secondary | ICD-10-CM | POA: Diagnosis not present

## 2017-06-03 DIAGNOSIS — Z125 Encounter for screening for malignant neoplasm of prostate: Secondary | ICD-10-CM | POA: Diagnosis not present

## 2017-06-03 DIAGNOSIS — Z0000 Encounter for general adult medical examination without abnormal findings: Secondary | ICD-10-CM | POA: Diagnosis not present

## 2017-06-08 DIAGNOSIS — E298 Other testicular dysfunction: Secondary | ICD-10-CM | POA: Diagnosis not present

## 2017-06-10 DIAGNOSIS — Z1212 Encounter for screening for malignant neoplasm of rectum: Secondary | ICD-10-CM | POA: Diagnosis not present

## 2017-06-10 DIAGNOSIS — Z1389 Encounter for screening for other disorder: Secondary | ICD-10-CM | POA: Diagnosis not present

## 2017-06-10 DIAGNOSIS — R0683 Snoring: Secondary | ICD-10-CM | POA: Diagnosis not present

## 2017-06-10 DIAGNOSIS — D751 Secondary polycythemia: Secondary | ICD-10-CM | POA: Diagnosis not present

## 2017-06-10 DIAGNOSIS — Z0000 Encounter for general adult medical examination without abnormal findings: Secondary | ICD-10-CM | POA: Diagnosis not present

## 2017-06-10 DIAGNOSIS — E298 Other testicular dysfunction: Secondary | ICD-10-CM | POA: Diagnosis not present

## 2017-06-10 DIAGNOSIS — E7849 Other hyperlipidemia: Secondary | ICD-10-CM | POA: Diagnosis not present

## 2017-07-08 DIAGNOSIS — E291 Testicular hypofunction: Secondary | ICD-10-CM | POA: Diagnosis not present

## 2017-08-03 DIAGNOSIS — E291 Testicular hypofunction: Secondary | ICD-10-CM | POA: Diagnosis not present

## 2017-08-16 ENCOUNTER — Encounter: Payer: Self-pay | Admitting: Neurology

## 2017-08-16 ENCOUNTER — Ambulatory Visit: Payer: BLUE CROSS/BLUE SHIELD | Admitting: Neurology

## 2017-08-16 VITALS — BP 130/88 | HR 80 | Ht 72.0 in | Wt 218.0 lb

## 2017-08-16 DIAGNOSIS — G4730 Sleep apnea, unspecified: Secondary | ICD-10-CM

## 2017-08-16 DIAGNOSIS — E663 Overweight: Secondary | ICD-10-CM

## 2017-08-16 DIAGNOSIS — G4700 Insomnia, unspecified: Secondary | ICD-10-CM

## 2017-08-16 DIAGNOSIS — R0683 Snoring: Secondary | ICD-10-CM | POA: Diagnosis not present

## 2017-08-16 DIAGNOSIS — D751 Secondary polycythemia: Secondary | ICD-10-CM | POA: Diagnosis not present

## 2017-08-16 DIAGNOSIS — G4719 Other hypersomnia: Secondary | ICD-10-CM

## 2017-08-16 DIAGNOSIS — D45 Polycythemia vera: Secondary | ICD-10-CM
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
## 2017-08-16 NOTE — Patient Instructions (Signed)

## 2017-08-17 ENCOUNTER — Telehealth: Payer: Self-pay

## 2017-08-17 DIAGNOSIS — R0683 Snoring: Secondary | ICD-10-CM

## 2017-08-17 NOTE — Telephone Encounter (Signed)
[employer] denied in lab sleep study, need HST order 

## 2017-08-17 NOTE — Telephone Encounter (Signed)
HST order placed. 

## 2017-08-30 ENCOUNTER — Ambulatory Visit: Payer: BLUE CROSS/BLUE SHIELD | Admitting: Neurology

## 2017-08-30 DIAGNOSIS — R0683 Snoring: Secondary | ICD-10-CM

## 2017-08-30 DIAGNOSIS — G4733 Obstructive sleep apnea (adult) (pediatric): Secondary | ICD-10-CM

## 2017-08-31 DIAGNOSIS — E291 Testicular hypofunction: Secondary | ICD-10-CM | POA: Diagnosis not present

## 2017-09-08 ENCOUNTER — Telehealth: Payer: Self-pay

## 2017-09-08 NOTE — Progress Notes (Signed)
Patient referred by Dr. Philip Aspen, seen by me on 08/16/17, HST on 08/30/17.    Please call and notify the patient that the recent home sleep test showed obstructive sleep apnea. OSA is overall mild by number of events (did not go lower than 90% for saturation), but worth treating to see if he feels better after treatment, and, in particular, if the polycythemia improves with time. To that end, I recommend treatment for this in the form of autoPAP, which means, that we don't have to bring him in for a sleep study with CPAP, but will let him try an autoPAP machine at home, through a DME company (of his choice, or as per insurance requirement). The DME representative will educate him on how to use the machine, how to put the mask on, etc. I have placed an order in the chart. Please send referral, talk to patient, send report to referring MD. We will need a FU in sleep clinic for 10 weeks post-PAP set up, please arrange that with me or one of our NPs. Thanks,   [name], MD, PhD [facility])

## 2017-09-08 NOTE — Telephone Encounter (Signed)
I called pt. I advised pt that Dr. Rexene Alberts reviewed their sleep study results and found that pt has mild osa. Dr. Rexene Alberts recommends that pt start an auto pap at home, and we can monitor how he is feeling and seeing if his polycythemia improves. I reviewed PAP compliance expectations with the pt.  Pt is reluctant to start auto pap. Pt is asking if our sleep lab manager can reach out to him and discuss/show him an auto pap in person before he decides.
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
## 2017-09-08 NOTE — Addendum Note (Signed)
Addended by: [name] on: 09/08/2017 08:42 AM   Modules accepted: Orders

## 2017-09-08 NOTE — Telephone Encounter (Signed)
-----   Message from [name], MD sent at 09/08/2017  8:41 AM EDT ----- Patient referred by Dr. Philip Aspen, seen by me on 08/16/17, HST on 08/30/17.    Please call and notify the patient that the recent home sleep test showed obstructive sleep apnea. OSA is overall mild by number of events (did not go lower than 90% for saturation), but worth treating to see if he feels better after treatment, and, in particular, if the polycythemia improves with time. To that end, I recommend treatment for this in the form of autoPAP, which means, that we don't have to bring him in for a sleep study with CPAP, but will let him try an autoPAP machine at home, through a DME company (of his choice, or as per insurance requirement). The DME representative will educate him on how to use the machine, how to put the mask on, etc. I have placed an order in the chart. Please send referral, talk to patient, send report to referring MD. We will need a FU in sleep clinic for 10 weeks post-PAP set up, please arrange that with me or one of our NPs. Thanks,   [name], MD, PhD [facility])

## 2017-09-09 NOTE — Telephone Encounter (Signed)
I called pt, advised him that the auto pap order will be going to [facility]. Pt scheduled a follow up appt for 12/02/2017 at 1:00pm. Pt verbalized understanding of recommendations.

## 2017-09-09 NOTE — Telephone Encounter (Signed)
Spoke with patient and explained the process of DME and Auto Cpap. I explained that after 30 days if he struggles with mask to give me a call and we can do mask fitting. He is ready to start Auto cpap now.

## 2017-09-16 NOTE — Telephone Encounter (Signed)
I called pt. I explained that we need additional information regarding his sleep to get his cpap approved by his insurance. Pt reports to me that he is struggling with insomnia. He wakes up in the night and is unable to go back to sleep. He wakes up several times throughout the night. Pt also reports that he is very sleepy in the afternoons at work. He has to take a nap during lunch.

## 2017-09-27 NOTE — Telephone Encounter (Signed)
Please note that patient does endorse daytime somnolence and insomnia at night with sleep maintenance difficulty. In light of symptoms associated with sleep apnea he qualifies for treatment for his mild sleep apnea.I made an addendum to his original office visit note from 08/16/2017. Please fax updated office visit note and diagnosis codes to his DME company.

## 2017-10-05 DIAGNOSIS — E291 Testicular hypofunction: Secondary | ICD-10-CM | POA: Diagnosis not present

## 2017-10-27 DIAGNOSIS — G4733 Obstructive sleep apnea (adult) (pediatric): Secondary | ICD-10-CM | POA: Diagnosis not present

## 2017-11-02 DIAGNOSIS — E291 Testicular hypofunction: Secondary | ICD-10-CM | POA: Diagnosis not present

## 2017-11-24 DIAGNOSIS — E291 Testicular hypofunction: Secondary | ICD-10-CM | POA: Diagnosis not present

## 2017-11-27 DIAGNOSIS — G4733 Obstructive sleep apnea (adult) (pediatric): Secondary | ICD-10-CM | POA: Diagnosis not present

## 2017-11-30 ENCOUNTER — Encounter: Payer: Self-pay | Admitting: Neurology

## 2017-12-02 ENCOUNTER — Ambulatory Visit: Payer: BLUE CROSS/BLUE SHIELD | Admitting: Neurology

## 2017-12-02 ENCOUNTER — Encounter: Payer: Self-pay | Admitting: Neurology

## 2017-12-02 VITALS — BP 135/87 | HR 87 | Ht 72.0 in | Wt 218.0 lb

## 2017-12-02 DIAGNOSIS — G4733 Obstructive sleep apnea (adult) (pediatric): Secondary | ICD-10-CM | POA: Diagnosis not present

## 2017-12-02 DIAGNOSIS — Z9989 Dependence on other enabling machines and devices: Secondary | ICD-10-CM | POA: Diagnosis not present
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
## 2017-12-02 NOTE — Patient Instructions (Signed)
Please continue using your autoPAP regularly. While your insurance requires that you use PAP at least 4 hours each night on 70% of the nights, I recommend, that you not skip any nights and use it throughout the night if you can. Getting used to PAP and staying with the treatment long term does take time and patience and discipline. Untreated obstructive sleep apnea when it is moderate to severe can have an adverse impact on cardiovascular health and raise her risk for heart disease, arrhythmias, hypertension, congestive heart failure, stroke and diabetes. Untreated obstructive sleep apnea causes sleep disruption, nonrestorative sleep, and sleep deprivation. This can have an impact on your day to day functioning and cause daytime sleepiness and impairment of cognitive function, memory loss, mood disturbance, and problems focussing. Using PAP regularly can improve these symptoms.  I would suggest you talk to Dr. Philip Aspen about rechecking your blood work in a  month or two.

## 2017-12-21 DIAGNOSIS — E291 Testicular hypofunction: Secondary | ICD-10-CM | POA: Diagnosis not present

## 2018-02-09 DIAGNOSIS — E291 Testicular hypofunction: Secondary | ICD-10-CM | POA: Diagnosis not present

## 2018-03-29 ENCOUNTER — Telehealth: Payer: Self-pay | Admitting: Neurology

## 2018-03-29 NOTE — Telephone Encounter (Signed)
Patient is changing insurances in January to [place] and he wants to know what he needs to do about his CPAP supplies.

## 2018-03-29 NOTE — Telephone Encounter (Signed)
I called pt to discuss his cpap supplies. No answer, left a message asking him to call me back.

## 2018-03-29 NOTE — Telephone Encounter (Signed)
I called pt. He reports that he was told by [employer] that he was not compliant enough by [facility] standards to get new supplies or keep his machine. He is wondering what to do. I advised him that I would reach out to [employer] and have them discuss this further with him. Unfortunately our office cannot alter insurance policies. If pt needs a sooner appt, we can definitely get him in sooner. Pt verbalized understanding.  I have reached out to [employer] to find out what is going on.

## 2018-03-30 NOTE — Telephone Encounter (Signed)
I had told patient the "occasional" skipped night would not be a big deal, but of course most nights he would have to use it, we even talked about insurance compliance criteria, ie 70%+ nights have to be 4+ hours of usage, I am not sure how he misunderstood this. It sounds like he skips it for other reasons: HAs, sinus issues... He has as I understand the option to continue PAP treatment after a FU visit? Please offer appt to him. Other option can be to purchase machine out of pocket usually and the last option unfortunately, would be to forego PAP treatment and consider alternative options, including surgical eval, wt loss, oral appl. Please clarify again with patient.

## 2018-03-30 NOTE — Telephone Encounter (Signed)
I called pt to discuss. No answer, left a message asking him to call me back. 

## 2018-03-30 NOTE — Telephone Encounter (Signed)
I called pt back and relayed Dr. Guadelupe Sabin recommendations. Pt reports that he will call [employer] to return his machine. Pt wants an appt in January to discuss restarting the process and he understands that he will need another sleep study. An appt was made for 05/11/18 at 8:30am. Pt verbalized understanding of recommendations, appt date and time.

## 2018-03-30 NOTE — Telephone Encounter (Signed)
Received this notice from [employer]:   "I spoke with this patient earlier. He called resupply to get some tubing and they transferred him to the local office for supplies. Typically under [employer] - supplies are covered; however, we have not been able to get reauthorization through [facility] for this patient as he is non-compliant. [employer] will not reauthorize him for PAP or supplies.  As you know, most [employer] policies rent for 3 months, require compliance and then will reauth for another 3 months until 10 months is reached for Capped rental.   I discussed this with the patient and he stated that Dr. Rexene Alberts told him he did not have to wear it every night that he could go a few days with out it i.e. going out of town, etc. Patient however, is only at 13% the last 30 days. He wears it for 3 or 4 days and then doesn't wear it at all for 5 days straight. He says it's because he is having headaches and sinus issues so he takes it off or just doesn't use it. Patient said that he "must've been told wrong" about compliance because he "didn't know" he had to use it every night.    I explained to patient that I could not provide supplies, at no cost, as he is not actively billing at this time. I did offer to send out tubing (what he was asking for) but that it would be billed to him, not the insurance. He did not want to do that.   We discussed the PAP further. patient then states that he will not have [employer] after 12/31 because he will have [place]. At this point, I let the patient know that whether he had met compliance or not, at this point, [employer] will not authorize continued rental because the policy is ending.    I made 3 suggestions: 1) Contact Dr. Rexene Alberts and schedule an appointment for after 04/13/18 so that we can restart this process for PAP therapy with his new insurance 2) Contact Dr. Rexene Alberts to see if he needs to continue PAP therapy - if not he can request to be d/c from it 3) We could provide him with a pay off  amount for the equipment - at patient cost   He said he felt that calling Dr. Guadelupe Sabin office would be his best bet so that he would know what he needs to do next."

## 2018-03-30 NOTE — Telephone Encounter (Signed)
Pt has called RN Kristen back.  Please call pt back

## 2018-04-01 ENCOUNTER — Encounter: Payer: Self-pay | Admitting: Neurology

## 2018-04-01 DIAGNOSIS — E291 Testicular hypofunction: Secondary | ICD-10-CM | POA: Diagnosis not present

## 2018-04-01 DIAGNOSIS — D751 Secondary polycythemia: Secondary | ICD-10-CM | POA: Diagnosis not present

## 2018-05-11 ENCOUNTER — Ambulatory Visit (INDEPENDENT_AMBULATORY_CARE_PROVIDER_SITE_OTHER): Payer: Managed Care, Other (non HMO) | Admitting: Neurology

## 2018-05-11 ENCOUNTER — Encounter: Payer: Self-pay | Admitting: Neurology

## 2018-05-11 VITALS — BP 144/97 | HR 84 | Ht 72.0 in | Wt 220.0 lb

## 2018-05-11 DIAGNOSIS — D751 Secondary polycythemia: Secondary | ICD-10-CM | POA: Diagnosis not present

## 2018-05-11 DIAGNOSIS — G4733 Obstructive sleep apnea (adult) (pediatric): Secondary | ICD-10-CM
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
## 2018-05-11 NOTE — Patient Instructions (Signed)
We will repeat your home sleep test to qualify for autoPAP therapy is again.

## 2018-05-25 ENCOUNTER — Ambulatory Visit: Payer: BLUE CROSS/BLUE SHIELD | Admitting: Neurology

## 2018-05-25 ENCOUNTER — Ambulatory Visit (INDEPENDENT_AMBULATORY_CARE_PROVIDER_SITE_OTHER): Payer: Managed Care, Other (non HMO) | Admitting: Neurology

## 2018-05-25 DIAGNOSIS — G4733 Obstructive sleep apnea (adult) (pediatric): Secondary | ICD-10-CM

## 2018-05-25 DIAGNOSIS — D751 Secondary polycythemia: Secondary | ICD-10-CM

## 2018-05-31 ENCOUNTER — Telehealth: Payer: Self-pay

## 2018-05-31 NOTE — Addendum Note (Signed)
Addended by: [name] on: 05/31/2018 07:46 AM   Modules accepted: Orders

## 2018-05-31 NOTE — Telephone Encounter (Signed)
-----   Message from [name], MD sent at 05/31/2018  7:46 AM EST ----- Patient had HST to requalify for autoPAP. Please advise patient that I will reorder autoPAP and we can re-initiate treatment. He will need FU with me or NP in about 10 weeks.  Leonard Caldwell

## 2018-05-31 NOTE — Telephone Encounter (Signed)
I called pt. I advised pt that Dr. Rexene Alberts reviewed their sleep study results and found that pt has osa. Dr. Rexene Alberts recommends that pt start an auto pap at home. I reviewed PAP compliance expectations with the pt. Pt is agreeable to starting an auto-PAP. I advised pt that an order will be sent to a DME, [facility], and [facility] will call the pt within about one week after they file with the pt's insurance. [facility] will show the pt how to use the machine, fit for masks, and troubleshoot the auto-PAP if needed. A follow up appt was made for insurance purposes with Dr. Rexene Alberts on 08/18/2018 at 8:30am. Pt verbalized understanding to arrive 15 minutes early and bring their auto-PAP. A letter with all of this information in it will be mailed to the pt as a reminder. I verified with the pt that the address we have on file is correct. Pt verbalized understanding of results. Pt had no questions at this time but was encouraged to call back if questions arise. I have sent the order to [facility] and have received confirmation that they have received the order.

## 2018-05-31 NOTE — Progress Notes (Signed)
Patient had HST to requalify for autoPAP. Please advise patient that I will reorder autoPAP and we can re-initiate treatment. He will need FU with me or NP in about 10 weeks.  Michel Bickers
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
## 2018-07-20 ENCOUNTER — Telehealth: Payer: Self-pay | Admitting: Neurology

## 2018-07-20 NOTE — Telephone Encounter (Signed)
Noted, thanks, pt just needs at least 31 days of data before his follow up.

## 2018-07-20 NOTE — Telephone Encounter (Signed)
Called patient regarding his 5/7 appointment, offered a sooner appointment via virtual visit. Patient declined, stating he has not yet started his cpap, due to COVID-19. He states the DME company wants him to come in to get it towards the end of May. I rescheduled his appointment for the end of June.

## 2018-08-18 ENCOUNTER — Ambulatory Visit: Payer: Self-pay | Admitting: Neurology

## 2018-08-25 NOTE — Telephone Encounter (Signed)
Received this notice from [facility]: "Vickie Epley! I know we have spoken a gazillion times about this patient.   He just called in and rescheduled his appointment, again, for PAP setup.   Pt states he has had a death in the family and will be out of town. He has rescheduled for 09/21/18 @ 3:30 pm.   Thank you!"  I called pt, cancelled his 10/03/2018 appt, moved him to 11/21/18 at 3:00pm. Pt verbalized understanding of new appt date and time.

## 2018-10-03 ENCOUNTER — Ambulatory Visit: Payer: Self-pay | Admitting: Neurology

## 2018-11-07 ENCOUNTER — Telehealth: Payer: Self-pay

## 2018-11-07 NOTE — Telephone Encounter (Signed)
I called pt, r/s his appt from 8/10 to 8/17 at 1:00pm. Pt verbalized understanding of new appt date and time.

## 2018-11-21 ENCOUNTER — Ambulatory Visit: Payer: Self-pay | Admitting: Neurology

## 2018-11-28 ENCOUNTER — Encounter: Payer: Self-pay | Admitting: Neurology

## 2018-11-28 ENCOUNTER — Other Ambulatory Visit: Payer: Self-pay

## 2018-11-28 ENCOUNTER — Ambulatory Visit: Payer: Managed Care, Other (non HMO) | Admitting: Neurology

## 2018-11-28 VITALS — BP 150/96 | HR 73 | Ht 72.0 in | Wt 225.0 lb

## 2018-11-28 DIAGNOSIS — G4733 Obstructive sleep apnea (adult) (pediatric): Secondary | ICD-10-CM | POA: Diagnosis not present

## 2018-11-28 DIAGNOSIS — Z9989 Dependence on other enabling machines and devices: Secondary | ICD-10-CM | POA: Diagnosis not present
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
## 2018-11-28 NOTE — Patient Instructions (Signed)
Please continue using your autoPAP regularly. While your insurance requires that you use PAP at least 4 hours each night on 70% of the nights, I recommend, that you not skip any nights and use it throughout the night if you can. Getting used to PAP and staying with the treatment long term does take time and patience and discipline. Untreated obstructive sleep apnea when it is moderate to severe can have an adverse impact on cardiovascular health and raise her risk for heart disease, arrhythmias, hypertension, congestive heart failure, stroke and diabetes. Untreated obstructive sleep apnea causes sleep disruption, nonrestorative sleep, and sleep deprivation. This can have an impact on your day to day functioning and cause daytime sleepiness and impairment of cognitive function, memory loss, mood disturbance, and problems focussing. Using PAP regularly can improve these symptoms.   Keep up the good work! We can see you back in a year. You can see one of our nurse practitioners too.

## 2019-05-02 DIAGNOSIS — G4733 Obstructive sleep apnea (adult) (pediatric): Secondary | ICD-10-CM | POA: Diagnosis not present

## 2019-06-21 DIAGNOSIS — E7849 Other hyperlipidemia: Secondary | ICD-10-CM | POA: Diagnosis not present

## 2019-06-21 DIAGNOSIS — Z125 Encounter for screening for malignant neoplasm of prostate: Secondary | ICD-10-CM | POA: Diagnosis not present

## 2019-06-21 DIAGNOSIS — Z0000 Encounter for general adult medical examination without abnormal findings: Secondary | ICD-10-CM | POA: Diagnosis not present

## 2019-06-27 DIAGNOSIS — R82998 Other abnormal findings in urine: Secondary | ICD-10-CM | POA: Diagnosis not present

## 2019-06-29 DIAGNOSIS — Z0000 Encounter for general adult medical examination without abnormal findings: Secondary | ICD-10-CM | POA: Diagnosis not present

## 2019-06-29 DIAGNOSIS — Z1331 Encounter for screening for depression: Secondary | ICD-10-CM | POA: Diagnosis not present

## 2019-06-29 DIAGNOSIS — D751 Secondary polycythemia: Secondary | ICD-10-CM | POA: Diagnosis not present

## 2019-06-29 DIAGNOSIS — I10 Essential (primary) hypertension: Secondary | ICD-10-CM | POA: Diagnosis not present

## 2019-06-29 DIAGNOSIS — G4733 Obstructive sleep apnea (adult) (pediatric): Secondary | ICD-10-CM | POA: Diagnosis not present

## 2019-06-29 DIAGNOSIS — E291 Testicular hypofunction: Secondary | ICD-10-CM | POA: Diagnosis not present

## 2019-07-06 DIAGNOSIS — Z20828 Contact with and (suspected) exposure to other viral communicable diseases: Secondary | ICD-10-CM | POA: Diagnosis not present

## 2019-07-06 DIAGNOSIS — Z03818 Encounter for observation for suspected exposure to other biological agents ruled out: Secondary | ICD-10-CM | POA: Diagnosis not present

## 2019-07-12 DIAGNOSIS — Z1212 Encounter for screening for malignant neoplasm of rectum: Secondary | ICD-10-CM | POA: Diagnosis not present

## 2019-07-20 ENCOUNTER — Ambulatory Visit: Payer: Self-pay | Attending: Internal Medicine

## 2019-07-20 DIAGNOSIS — Z23 Encounter for immunization: Secondary | ICD-10-CM

## 2019-07-20 NOTE — Progress Notes (Signed)
   Covid-19 Vaccination Clinic  Name:  Leonard Caldwell    MRN: [id number] DOB: 01/25/1961  07/20/2019  [name] was observed post Covid-19 immunization for 15 minutes without incident. He was provided with Vaccine Information Sheet and instruction to access the V-Safe system.   [name] was instructed to call 911 with any severe reactions post vaccine: Marland Kitchen Difficulty breathing  . Swelling of face and throat  . A fast heartbeat  . A bad rash all over body  . Dizziness and weakness   Immunizations Administered    Name Date Dose VIS Date Route   Pfizer COVID-19 Vaccine 07/20/2019  8:55 AM 0.3 mL 03/24/2019 Intramuscular   Manufacturer: [place]   Lot: [id number]   [place]: [id number]

## 2019-08-14 ENCOUNTER — Ambulatory Visit: Payer: Self-pay | Attending: Internal Medicine

## 2019-08-14 DIAGNOSIS — Z23 Encounter for immunization: Secondary | ICD-10-CM

## 2019-08-14 NOTE — Progress Notes (Signed)
   Covid-19 Vaccination Clinic  Name:  Leonard Caldwell    MRN: [id number] DOB: 1960-05-20  08/14/2019  [name] was observed post Covid-19 immunization for 15 minutes without incident. He was provided with Vaccine Information Sheet and instruction to access the V-Safe system.   [name] was instructed to call 911 with any severe reactions post vaccine: Marland Kitchen Difficulty breathing  . Swelling of face and throat  . A fast heartbeat  . A bad rash all over body  . Dizziness and weakness   Immunizations Administered    Name Date Dose VIS Date Route   Pfizer COVID-19 Vaccine 08/14/2019  8:54 AM 0.3 mL 06/07/2018 Intramuscular   Manufacturer: [place]   Lot: [id number]   [place]: [id number]

## 2019-10-04 DIAGNOSIS — E291 Testicular hypofunction: Secondary | ICD-10-CM | POA: Diagnosis not present

## 2019-10-26 DIAGNOSIS — E291 Testicular hypofunction: Secondary | ICD-10-CM | POA: Diagnosis not present

## 2019-11-29 ENCOUNTER — Encounter: Payer: Self-pay | Admitting: Neurology

## 2019-11-29 ENCOUNTER — Ambulatory Visit: Payer: Managed Care, Other (non HMO) | Admitting: Neurology

## 2019-12-04 ENCOUNTER — Telehealth: Payer: Self-pay

## 2019-12-04 NOTE — Telephone Encounter (Signed)
I called the pt back. When he originally called he needed to reschedule his office visit. Pt has successfully done this for 12/07/2019.  Pt had no other questions at this time.

## 2019-12-04 NOTE — Telephone Encounter (Signed)
Pt left a VM asking for a call to discuss his missed appt.

## 2019-12-07 ENCOUNTER — Ambulatory Visit: Payer: BC Managed Care – PPO | Admitting: Neurology

## 2019-12-07 ENCOUNTER — Encounter: Payer: Self-pay | Admitting: Neurology

## 2019-12-07 VITALS — BP 122/80 | HR 77 | Ht 72.0 in | Wt 223.0 lb

## 2019-12-07 DIAGNOSIS — Z9989 Dependence on other enabling machines and devices: Secondary | ICD-10-CM | POA: Diagnosis not present

## 2019-12-07 DIAGNOSIS — G4733 Obstructive sleep apnea (adult) (pediatric): Secondary | ICD-10-CM | POA: Diagnosis not present
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
## 2019-12-07 NOTE — Patient Instructions (Signed)
Patient declined printed copy of AVS.  Continue using AutoPAP. FU 1 year for sleep apnea, may see NP. Verbal AVS given.

## 2019-12-12 DIAGNOSIS — E291 Testicular hypofunction: Secondary | ICD-10-CM | POA: Diagnosis not present

## 2020-01-17 DIAGNOSIS — E291 Testicular hypofunction: Secondary | ICD-10-CM | POA: Diagnosis not present

## 2020-06-27 DIAGNOSIS — G4733 Obstructive sleep apnea (adult) (pediatric): Secondary | ICD-10-CM | POA: Diagnosis not present

## 2020-07-01 DIAGNOSIS — E785 Hyperlipidemia, unspecified: Secondary | ICD-10-CM | POA: Diagnosis not present

## 2020-07-01 DIAGNOSIS — Z0000 Encounter for general adult medical examination without abnormal findings: Secondary | ICD-10-CM | POA: Diagnosis not present

## 2020-07-01 DIAGNOSIS — Z125 Encounter for screening for malignant neoplasm of prostate: Secondary | ICD-10-CM | POA: Diagnosis not present

## 2020-07-01 DIAGNOSIS — E291 Testicular hypofunction: Secondary | ICD-10-CM | POA: Diagnosis not present

## 2020-07-08 DIAGNOSIS — N529 Male erectile dysfunction, unspecified: Secondary | ICD-10-CM | POA: Diagnosis not present

## 2020-07-08 DIAGNOSIS — Z0000 Encounter for general adult medical examination without abnormal findings: Secondary | ICD-10-CM | POA: Diagnosis not present

## 2020-07-08 DIAGNOSIS — Z1331 Encounter for screening for depression: Secondary | ICD-10-CM | POA: Diagnosis not present

## 2020-07-08 DIAGNOSIS — R82998 Other abnormal findings in urine: Secondary | ICD-10-CM | POA: Diagnosis not present

## 2020-07-08 DIAGNOSIS — Z1212 Encounter for screening for malignant neoplasm of rectum: Secondary | ICD-10-CM | POA: Diagnosis not present

## 2020-07-08 DIAGNOSIS — Z1389 Encounter for screening for other disorder: Secondary | ICD-10-CM | POA: Diagnosis not present

## 2020-12-09 ENCOUNTER — Ambulatory Visit: Payer: Self-pay | Admitting: Family Medicine
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
## 2021-03-26 ENCOUNTER — Ambulatory Visit: Payer: Self-pay | Admitting: Family Medicine

## 2021-04-10 NOTE — Progress Notes (Deleted)
[facility] [address] [place]. [place] [address] [phone]       HOSPITAL FOLLOW UP NOTE  Mr. Leonard Caldwell Date of Birth:  1960/12/10 Medical Record Number:  [id number]   Reason for Referral:  hospital stroke follow up    SUBJECTIVE:   CHIEF COMPLAINT:  No chief complaint on file.   HPI:   Update 04/15/2021 JM: Patient returns for CPAP compliance visit after prior visit with Dr. Rexene Alberts on 12/07/2019. Per OV note review, compliance 30% with residual AHI 2.8. today, reports continued low compliance ***                ROS:   14 system review of systems performed and negative with exception of ***  PMH:  Past Medical History:  Diagnosis Date   Allergy    seasonal   Dysphagia off and on for last year   GERD (gastroesophageal reflux disease)    Hyperlipidemia    Polycythemia    PONV (postoperative nausea and vomiting)    nausea in past, no nausea with last procedure 2 weeks ago    PSH:  Past Surgical History:  Procedure Laterality Date   BALLOON DILATION N/A 09/19/2013   Procedure: BALLOON DILATION;  Surgeon: Inda Castle, MD;  Location: [facility];  Service: Endoscopy;  Laterality: N/A;   ESOPHAGOGASTRODUODENOSCOPY  2 weeks ago   ESOPHAGOGASTRODUODENOSCOPY N/A 09/19/2013   Procedure: ESOPHAGOGASTRODUODENOSCOPY (EGD);  Surgeon: Inda Castle, MD;  Location: [facility];  Service: Endoscopy;  Laterality: N/A;   NOSE SURGERY     TONSILLECTOMY     UPPER GASTROINTESTINAL ENDOSCOPY      Social History:  Social History   Socioeconomic History   Marital status: Married    Spouse name: Not on file   Number of children: 1   Years of education: Not on file   Highest education level: Not on file  Occupational History   Occupation: Sales promotion account executive  Tobacco Use   Smoking status: Former    Types: Cigarettes    Quit date: 04/13/2005    Years since quitting: 16.0   Smokeless tobacco: Former    Types: Chew    Quit date:  04/14/1983  Substance and Sexual Activity   Alcohol use: Yes    Comment: seldom   Drug use: No   Sexual activity: Not on file  Other Topics Concern   Not on file  Social History Narrative   Not on file   Social Determinants of Health   Financial Resource Strain: Not on file  Food Insecurity: Not on file  Transportation Needs: Not on file  Physical Activity: Not on file  Stress: Not on file  Social Connections: Not on file  Intimate Partner Violence: Not on file    Family History:  Family History  Adopted: Yes  Family history unknown: Yes    Medications:   Current Outpatient Medications on File Prior to Visit  Medication Sig Dispense Refill   ezetimibe (ZETIA) 10 MG tablet Take 10 mg by mouth daily.     lansoprazole (PREVACID) 30 MG capsule Take 1 capsule daily at least 30 minutes before breakfast. 90 capsule 3   pravastatin (PRAVACHOL) 40 MG tablet Take 40 mg by mouth daily.     testosterone cypionate (DEPOTESTOSTERONE CYPIONATE) 200 MG/ML injection Inject into the muscle every 14 (fourteen) days.     No current facility-administered medications on file prior to visit.    Allergies:   Allergies  Allergen Reactions   Codeine  Other (See Comments)    "wild"      OBJECTIVE:  Physical Exam  There were no vitals filed for this visit. There is no height or weight on file to calculate BMI. No results found.  General: well developed, well nourished, seated, in no evident distress Head: head normocephalic and atraumatic.   Neck: supple with no carotid or supraclavicular bruits Cardiovascular: regular rate and rhythm, no murmurs Musculoskeletal: no deformity Skin:  no rash/petichiae Vascular:  Normal pulses all extremities   Neurologic Exam Mental Status: Awake and fully alert. Oriented to place and time. Recent and remote memory intact. Attention span, concentration and fund of knowledge appropriate. Mood and affect appropriate.  Cranial Nerves: Pupils equal,  briskly reactive to light. Extraocular movements full without nystagmus. Visual fields full to confrontation. Hearing intact. Facial sensation intact. Face, tongue, palate moves normally and symmetrically.  Motor: Normal bulk and tone. Normal strength in all tested extremity muscles Sensory.: intact to touch , pinprick , position and vibratory sensation.  Coordination: Rapid alternating movements normal in all extremities. Finger-to-nose and heel-to-shin performed accurately bilaterally. Gait and Station: Arises from chair without difficulty. Stance is normal. Gait demonstrates normal stride length and balance without use of AD. Tandem walk and heel toe without difficulty.  Reflexes: 1+ and symmetric. Toes downgoing.        ASSESSMENT/PLAN: Leonard Caldwell is a 60 y.o. year old male with OSA on CPAP since 05/2018 followed by Dr. Rexene Alberts.    1.  OSA on CPAP -Compliance remains low consistent with prior compliance report in 11/2019 -Discussed importance of increasing nightly CPAP usage -Continue to follow with DME company for any needed supplies or CPAP related concerns   No orders of the defined types were placed in this encounter.      Follow up in *** or call earlier if needed   CC:  PCP: Donnajean Lopes, MD    I spent *** minutes of face-to-face and non-face-to-face time with patient.  This included previsit chart review, lab review, study review, order entry, electronic health record documentation, patient education regarding OSA on CPAP with review and discussion of compliance report, importance of nightly CPAP compliance and answered all other questions to patient satisfaction   Frann Rider, AGNP-BC  [facility] [address] [place], [place] [address]  Phone [phone] Fax [phone] Note: This document was prepared with digital dictation and possible smart phrase technology. Any transcriptional errors that result from this process  are unintentional.

## 2021-04-15 ENCOUNTER — Telehealth: Payer: Self-pay | Admitting: Adult Health

## 2021-04-15 ENCOUNTER — Ambulatory Visit: Payer: No Typology Code available for payment source | Admitting: Adult Health

## 2021-04-15 NOTE — Telephone Encounter (Signed)
FYI- pt has COVID, had to reschedule appt.

## 2021-06-05 ENCOUNTER — Ambulatory Visit: Payer: No Typology Code available for payment source | Admitting: Adult Health

## 2021-06-15 ENCOUNTER — Encounter: Payer: Self-pay | Admitting: Adult Health

## 2021-06-16 ENCOUNTER — Encounter: Payer: Self-pay | Admitting: Adult Health

## 2021-06-16 ENCOUNTER — Ambulatory Visit: Payer: BC Managed Care – PPO | Admitting: Adult Health

## 2021-06-16 ENCOUNTER — Other Ambulatory Visit: Payer: Self-pay

## 2021-06-16 VITALS — BP 127/81 | HR 77 | Ht 72.0 in | Wt 215.0 lb

## 2021-06-16 DIAGNOSIS — G4733 Obstructive sleep apnea (adult) (pediatric): Secondary | ICD-10-CM

## 2021-06-16 DIAGNOSIS — Z9989 Dependence on other enabling machines and devices: Secondary | ICD-10-CM | POA: Diagnosis not present
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
## 2021-08-22 DIAGNOSIS — Z0000 Encounter for general adult medical examination without abnormal findings: Secondary | ICD-10-CM | POA: Diagnosis not present

## 2021-08-22 DIAGNOSIS — E291 Testicular hypofunction: Secondary | ICD-10-CM | POA: Diagnosis not present

## 2021-08-22 DIAGNOSIS — Z125 Encounter for screening for malignant neoplasm of prostate: Secondary | ICD-10-CM | POA: Diagnosis not present

## 2021-08-22 DIAGNOSIS — E785 Hyperlipidemia, unspecified: Secondary | ICD-10-CM | POA: Diagnosis not present

## 2021-08-28 DIAGNOSIS — Z0000 Encounter for general adult medical examination without abnormal findings: Secondary | ICD-10-CM | POA: Diagnosis not present

## 2021-08-28 DIAGNOSIS — I10 Essential (primary) hypertension: Secondary | ICD-10-CM | POA: Diagnosis not present

## 2021-08-28 DIAGNOSIS — R82998 Other abnormal findings in urine: Secondary | ICD-10-CM | POA: Diagnosis not present

## 2021-08-28 DIAGNOSIS — Z1331 Encounter for screening for depression: Secondary | ICD-10-CM | POA: Diagnosis not present

## 2022-06-16 ENCOUNTER — Telehealth: Payer: Self-pay

## 2022-06-16 NOTE — Telephone Encounter (Signed)
Contacted pt, per NP rq, due to him not using CPAP in the last year. He stated he would like to discuss other treatment options. I rescheduled pt with MD on Thursday to discuss. Pt did mention interest in dental device.
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
## 2022-06-17 ENCOUNTER — Ambulatory Visit: Payer: BC Managed Care – PPO | Admitting: Adult Health

## 2022-06-18 ENCOUNTER — Ambulatory Visit: Payer: BC Managed Care – PPO | Admitting: Neurology

## 2022-06-18 ENCOUNTER — Encounter: Payer: Self-pay | Admitting: Neurology

## 2022-06-18 VITALS — BP 109/75 | HR 81 | Ht 72.0 in | Wt 213.0 lb

## 2022-06-18 DIAGNOSIS — E663 Overweight: Secondary | ICD-10-CM | POA: Diagnosis not present

## 2022-06-18 DIAGNOSIS — R634 Abnormal weight loss: Secondary | ICD-10-CM | POA: Diagnosis not present

## 2022-06-18 DIAGNOSIS — R0683 Snoring: Secondary | ICD-10-CM

## 2022-06-18 DIAGNOSIS — G4733 Obstructive sleep apnea (adult) (pediatric): Secondary | ICD-10-CM | POA: Diagnosis not present
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
## 2022-06-18 NOTE — Patient Instructions (Signed)
It was nice to see you again today.   As discussed, we will proceed with a home sleep test (HST) to re-assess your sleep apnea. Our sleep lab staff will reach out to you to arrange for pickup and for tutorial of your test equipment - you will do the test at home that night and bring the test sensors back for data analysis the next day or whenever you are scheduled for drop off of your test equipment. You may be eligible for a new machine. We may consider a referral to dentistry for an oral appliance.    We will call you with the home sleep test results and consider our treatment options based on the sleep test.

## 2022-07-09 ENCOUNTER — Telehealth: Payer: Self-pay | Admitting: Neurology

## 2022-07-09 NOTE — Telephone Encounter (Signed)
HST- [employer] Josem Kaufmann: [id number] (exp. 07/07/22 to 09/04/22)   Patient is scheduled at [facility] for 08/04/22 at 9 AM.  Mailed packet to the patient.

## 2022-07-16 NOTE — Telephone Encounter (Signed)
Patient called stated he needed to r/s his appt he will be out of town  He is r/s for 08/11/22 at 9 AM.  Mailed new packet to the patient.

## 2022-08-11 ENCOUNTER — Ambulatory Visit: Payer: BC Managed Care – PPO | Admitting: Neurology

## 2022-08-11 DIAGNOSIS — G4733 Obstructive sleep apnea (adult) (pediatric): Secondary | ICD-10-CM

## 2022-08-11 DIAGNOSIS — E663 Overweight: Secondary | ICD-10-CM

## 2022-08-11 DIAGNOSIS — R634 Abnormal weight loss: Secondary | ICD-10-CM

## 2022-08-11 DIAGNOSIS — R0683 Snoring: Secondary | ICD-10-CM

## 2022-08-13 NOTE — Progress Notes (Signed)
See procedure note.
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
## 2022-08-19 ENCOUNTER — Telehealth: Payer: Self-pay | Admitting: *Deleted

## 2022-08-19 DIAGNOSIS — G4733 Obstructive sleep apnea (adult) (pediatric): Secondary | ICD-10-CM

## 2022-08-19 NOTE — Telephone Encounter (Signed)
-----   Message from Huston Foley, MD sent at 08/17/2022 12:41 PM EDT ----- Patient had a recent home sleep test for re-evaluation of his OSA.  He has not been using his AutoPap since March 2023.  Please advise patient that his home sleep test from 08/11/2022 indicated moderate obstructive sleep apnea.  I recommend that he continue with his AutoPap machine.  Please remind him to be fully compliant with treatment.  He can follow-up with the nurse practitioner in our sleep clinic in about 6 months.

## 2022-08-19 NOTE — Telephone Encounter (Signed)
Order sent to [employer].  

## 2022-08-19 NOTE — Telephone Encounter (Signed)
Order signed for autoPAP.  

## 2022-08-19 NOTE — Telephone Encounter (Signed)
I spoke with the patient and discussed his recent home sleep test results.  The patient is amenable to proceeding with AutoPap. Patient states he needs new supplies, particularly the headgear and hose.  He states he does not need other supplies.  Patient understands the insurance compliance requirements which includes using the machine at least 4 hours at night.  He scheduled an appointment with Shanda Bumps NP for Monday 02/15/23 at 7:45 AM arrival 7:15. He verbalized appreciation for the call and will receive a call from [employer] about new supplies.

## 2022-08-20 NOTE — Telephone Encounter (Signed)
[employer] confirmed receipt of order.  

## 2022-08-25 DIAGNOSIS — G4733 Obstructive sleep apnea (adult) (pediatric): Secondary | ICD-10-CM | POA: Diagnosis not present

## 2022-09-09 ENCOUNTER — Encounter: Payer: Self-pay | Admitting: Adult Health

## 2022-09-24 DIAGNOSIS — M5431 Sciatica, right side: Secondary | ICD-10-CM | POA: Diagnosis not present

## 2022-09-24 DIAGNOSIS — M9903 Segmental and somatic dysfunction of lumbar region: Secondary | ICD-10-CM | POA: Diagnosis not present

## 2022-09-28 DIAGNOSIS — M5431 Sciatica, right side: Secondary | ICD-10-CM | POA: Diagnosis not present

## 2022-09-28 DIAGNOSIS — M9903 Segmental and somatic dysfunction of lumbar region: Secondary | ICD-10-CM | POA: Diagnosis not present

## 2022-10-19 DIAGNOSIS — M5431 Sciatica, right side: Secondary | ICD-10-CM | POA: Diagnosis not present

## 2022-10-19 DIAGNOSIS — M9903 Segmental and somatic dysfunction of lumbar region: Secondary | ICD-10-CM | POA: Diagnosis not present

## 2022-11-02 DIAGNOSIS — M9903 Segmental and somatic dysfunction of lumbar region: Secondary | ICD-10-CM | POA: Diagnosis not present

## 2022-11-02 DIAGNOSIS — M5431 Sciatica, right side: Secondary | ICD-10-CM | POA: Diagnosis not present

## 2022-11-10 DIAGNOSIS — Z125 Encounter for screening for malignant neoplasm of prostate: Secondary | ICD-10-CM | POA: Diagnosis not present

## 2022-11-10 DIAGNOSIS — E785 Hyperlipidemia, unspecified: Secondary | ICD-10-CM | POA: Diagnosis not present

## 2022-11-10 DIAGNOSIS — Z1212 Encounter for screening for malignant neoplasm of rectum: Secondary | ICD-10-CM | POA: Diagnosis not present

## 2022-11-10 DIAGNOSIS — E291 Testicular hypofunction: Secondary | ICD-10-CM | POA: Diagnosis not present

## 2022-11-17 DIAGNOSIS — Z1339 Encounter for screening examination for other mental health and behavioral disorders: Secondary | ICD-10-CM | POA: Diagnosis not present

## 2022-11-17 DIAGNOSIS — I10 Essential (primary) hypertension: Secondary | ICD-10-CM | POA: Diagnosis not present

## 2022-11-17 DIAGNOSIS — Z1331 Encounter for screening for depression: Secondary | ICD-10-CM | POA: Diagnosis not present

## 2022-11-17 DIAGNOSIS — Z0000 Encounter for general adult medical examination without abnormal findings: Secondary | ICD-10-CM | POA: Diagnosis not present

## 2022-11-17 DIAGNOSIS — R82998 Other abnormal findings in urine: Secondary | ICD-10-CM | POA: Diagnosis not present

## 2022-12-01 DIAGNOSIS — M9903 Segmental and somatic dysfunction of lumbar region: Secondary | ICD-10-CM | POA: Diagnosis not present

## 2022-12-01 DIAGNOSIS — M5431 Sciatica, right side: Secondary | ICD-10-CM | POA: Diagnosis not present

## 2022-12-29 DIAGNOSIS — M9903 Segmental and somatic dysfunction of lumbar region: Secondary | ICD-10-CM | POA: Diagnosis not present

## 2022-12-29 DIAGNOSIS — M5431 Sciatica, right side: Secondary | ICD-10-CM | POA: Diagnosis not present

## 2023-01-26 DIAGNOSIS — M5431 Sciatica, right side: Secondary | ICD-10-CM | POA: Diagnosis not present

## 2023-01-26 DIAGNOSIS — M9903 Segmental and somatic dysfunction of lumbar region: Secondary | ICD-10-CM | POA: Diagnosis not present

## 2023-02-11 NOTE — Progress Notes (Signed)
[facility] [address] [place]. [place] [address] [phone]       OFFICE FOLLOW UP NOTE  Mr. Leonard Caldwell Date of Birth:  1960-06-17 Medical Record Number:  [id number]    Primary neurologist: Dr. Frances Furbish Reason for visit: Initial CPAP follow-up    SUBJECTIVE:   CHIEF COMPLAINT:  Chief Complaint  Patient presents with   Follow-up    Rm 3, here alone Pt is here for initial CPAP. Pt states he is doing well overall no concerns.     Follow-up visit:  Prior visit:  Brief HPI:   Leonard Caldwell is a 62 y.o. male who was initially evaluated by Dr. Frances Furbish in 08/2017 for concern of underlying sleep apnea. HST 11/2017 showed mild sleep apnea with total AHI of 10.8/h and advised to proceed with AutoPap therapy.  He eventually became noncompliant with CPAP therapy and required to return CPAP therefore repeat HST 05/2018 showed moderate sleep apnea with total AHI of 19.2/h and restarted CPAP therapy. Follow up in 06/2021 with suboptimal compliance due to frequent traveling without bringing CPAP and issues with allergies/congestion, he was motivated to continue with treatment.  At last visit with Dr. Frances Furbish on 06/18/2022, he had not used his CPAP machine since prior visit and did not feel CPAP needed although snoring still present and only minimal weight lost since initial study.  Discussed other treatment options such as oral appliance or Inspire but was not interested in surgical procedure with inspire device for treatment.  Recommended repeat HST which was completed on 08/11/2022 which showed moderate sleep apnea with total AHI of 27.2/h and O2 nadir of 83% and recommended restart treatment with AutoPap.     Interval history:  Compliance report over the past 30 days shows good compliance and optimal residual AHI.  Tolerating CPAP well, believes he may sleep a little longer with CPAP but no significant benefit. Still feels fatigued during the day especially towards the  end of the week. Can wake up throughout the night and at times difficult falling back asleep.  Has previously tried melatonin without any benefit.  ESS 6/24.           ROS:   14 system review of systems performed and negative with exception of those listed in HPI  PMH:  Past Medical History:  Diagnosis Date   Allergy    seasonal   Dysphagia off and on for last year   GERD (gastroesophageal reflux disease)    Hyperlipidemia    Polycythemia    PONV (postoperative nausea and vomiting)    nausea in past, no nausea with last procedure 2 weeks ago    PSH:  Past Surgical History:  Procedure Laterality Date   BALLOON DILATION N/A 09/19/2013   Procedure: BALLOON DILATION;  Surgeon: Louis Meckel, MD;  Location: [facility];  Service: Endoscopy;  Laterality: N/A;   ESOPHAGOGASTRODUODENOSCOPY  2 weeks ago   ESOPHAGOGASTRODUODENOSCOPY N/A 09/19/2013   Procedure: ESOPHAGOGASTRODUODENOSCOPY (EGD);  Surgeon: Louis Meckel, MD;  Location: [facility];  Service: Endoscopy;  Laterality: N/A;   NOSE SURGERY     TONSILLECTOMY     UPPER GASTROINTESTINAL ENDOSCOPY      Social History:  Social History   Socioeconomic History   Marital status: Married    Spouse name: Not on file   Number of children: 1   Years of education: Not on file   Highest education level: Not on file  Occupational History   Occupation: Nature conservation officer  Tobacco Use   Smoking status: Former    Current packs/day: 0.00    Types: Cigarettes    Quit date: 04/13/2005    Years since quitting: 17.8   Smokeless tobacco: Former    Types: Chew    Quit date: 04/14/1983  Substance and Sexual Activity   Alcohol use: Yes    Comment: seldom   Drug use: No   Sexual activity: Not on file  Other Topics Concern   Not on file  Social History Narrative   Not on file   Social Determinants of Health   Financial Resource Strain: Not on file  Food Insecurity: Not on file  Transportation Needs: Not on file  Physical  Activity: Not on file  Stress: Not on file  Social Connections: Not on file  Intimate Partner Violence: Not on file    Family History:  Family History  Adopted: Yes  Problem Relation Age of Onset   Sleep apnea Neg Hx     Medications:   Current Outpatient Medications on File Prior to Visit  Medication Sig Dispense Refill   amLODipine (NORVASC) 10 MG tablet Take 10 mg by mouth daily.     ezetimibe (ZETIA) 10 MG tablet Take 10 mg by mouth daily.     lansoprazole (PREVACID) 30 MG capsule Take 1 capsule daily at least 30 minutes before breakfast. 90 capsule 3   pravastatin (PRAVACHOL) 40 MG tablet Take 40 mg by mouth daily.     testosterone cypionate (DEPOTESTOSTERONE CYPIONATE) 200 MG/ML injection Inject into the muscle every 14 (fourteen) days.     No current facility-administered medications on file prior to visit.    Allergies:   Allergies  Allergen Reactions   Codeine Other (See Comments)    "wild"      OBJECTIVE:  Physical Exam  Vitals:   02/15/23 0738  BP: 113/77  Pulse: 78  Weight: 226 lb (102.5 kg)  Height: 6' (1.829 m)   Body mass index is 30.65 kg/m. No results found.   General: well developed, well nourished, pleasant middle-age Caucasian male, seated, in no evident distress  Neurologic Exam Mental Status: Awake and fully alert. Oriented to place and time. Recent and remote memory intact. Attention span, concentration and fund of knowledge appropriate. Mood and affect appropriate.  Cranial Nerves: Pupils equal, briskly reactive to light. Extraocular movements full without nystagmus. Visual fields full to confrontation. Hearing intact. Facial sensation intact. Face, tongue, palate moves normally and symmetrically.  Motor: Normal bulk and tone. Normal strength in all tested extremity muscles Gait and Station: Arises from chair without difficulty. Stance is normal. Gait demonstrates normal stride length and balance without use of AD. Reflexes: 1+ and  symmetric. Toes downgoing.         ASSESSMENT/PLAN: Leonard Caldwell is a 62 y.o. year old male with longstanding history of sleep apnea initially diagnosed in 2019, continued difficulties tolerating CPAP, machine returned in 2020 d/t noncompliance, repeat sleep study in 2020 showed moderate OSA with AHI 19.2/h and received new CPAP machine. In 2024, questioned if CPAP still needed, repeat sleep study showed continued moderate OSA with total AHI of 27.2/h and recommended continuation of CPAP.    OSA on CPAP : Compliance report shows satisfactory usage with optimal residual AHI.  Continue current pressure settings. Will be due for new CPAP next year.  Discussed continued nightly usage with ensuring greater than 4 hours nightly for optimal benefit and per insurance purposes.  Continue to follow with DME company for any  needed supplies or CPAP related concerns     Follow up in 1 year (declined MyChart VV) or call earlier if needed   CC:  PCP: Garlan Fillers, MD    I spent 20 minutes of face-to-face and non-face-to-face time with patient.  This included previsit chart review, lab review, study review, order entry, electronic health record documentation, patient education and discussion regarding above diagnoses and treatment plan and answered all other questions to patient's satisfaction  Ihor Austin, [facility]  [facility] [address] [place], [place] [address]  Phone [phone] Fax [phone] Note: This document was prepared with digital dictation and possible smart phrase technology. Any transcriptional errors that result from this process are unintentional.

## 2023-02-15 ENCOUNTER — Ambulatory Visit: Payer: BC Managed Care – PPO | Admitting: Adult Health

## 2023-02-15 ENCOUNTER — Encounter: Payer: Self-pay | Admitting: Adult Health

## 2023-02-15 VITALS — BP 113/77 | HR 78 | Ht 72.0 in | Wt 226.0 lb

## 2023-02-15 DIAGNOSIS — G4733 Obstructive sleep apnea (adult) (pediatric): Secondary | ICD-10-CM

## 2023-02-15 NOTE — Patient Instructions (Addendum)
Your Plan:  Continue nightly CPAP usage for adequate sleep apnea management  Continue routine follow-up with DME [employer] for any needed supplies or CPAP related concerns     Follow-up in 1 year or call earlier if needed      Thank you for coming to see [place] at [facility]. I hope we have been able to provide you high quality care today.  You may receive a patient satisfaction survey over the next few weeks. We would appreciate your feedback and comments so that we may continue to improve ourselves and the health of our patients.

## 2023-02-22 DIAGNOSIS — G4733 Obstructive sleep apnea (adult) (pediatric): Secondary | ICD-10-CM | POA: Diagnosis not present

## 2023-02-23 DIAGNOSIS — M5431 Sciatica, right side: Secondary | ICD-10-CM | POA: Diagnosis not present

## 2023-02-23 DIAGNOSIS — M9903 Segmental and somatic dysfunction of lumbar region: Secondary | ICD-10-CM | POA: Diagnosis not present

## 2023-03-09 DIAGNOSIS — G4733 Obstructive sleep apnea (adult) (pediatric): Secondary | ICD-10-CM | POA: Diagnosis not present

## 2023-03-24 DIAGNOSIS — M5431 Sciatica, right side: Secondary | ICD-10-CM | POA: Diagnosis not present

## 2023-03-24 DIAGNOSIS — M9903 Segmental and somatic dysfunction of lumbar region: Secondary | ICD-10-CM | POA: Diagnosis not present

## 2023-04-21 DIAGNOSIS — M9903 Segmental and somatic dysfunction of lumbar region: Secondary | ICD-10-CM | POA: Diagnosis not present

## 2023-04-21 DIAGNOSIS — M5431 Sciatica, right side: Secondary | ICD-10-CM | POA: Diagnosis not present

## 2023-05-19 DIAGNOSIS — M9903 Segmental and somatic dysfunction of lumbar region: Secondary | ICD-10-CM | POA: Diagnosis not present

## 2023-05-19 DIAGNOSIS — M5431 Sciatica, right side: Secondary | ICD-10-CM | POA: Diagnosis not present

## 2023-06-03 DIAGNOSIS — R051 Acute cough: Secondary | ICD-10-CM | POA: Diagnosis not present

## 2023-06-03 DIAGNOSIS — R0981 Nasal congestion: Secondary | ICD-10-CM | POA: Diagnosis not present

## 2023-06-03 DIAGNOSIS — J189 Pneumonia, unspecified organism: Secondary | ICD-10-CM | POA: Diagnosis not present

## 2023-06-15 DIAGNOSIS — M9903 Segmental and somatic dysfunction of lumbar region: Secondary | ICD-10-CM | POA: Diagnosis not present

## 2023-06-15 DIAGNOSIS — M5431 Sciatica, right side: Secondary | ICD-10-CM | POA: Diagnosis not present

## 2023-07-14 DIAGNOSIS — M9903 Segmental and somatic dysfunction of lumbar region: Secondary | ICD-10-CM | POA: Diagnosis not present

## 2023-07-14 DIAGNOSIS — M5431 Sciatica, right side: Secondary | ICD-10-CM | POA: Diagnosis not present

## 2023-07-24 DIAGNOSIS — G4733 Obstructive sleep apnea (adult) (pediatric): Secondary | ICD-10-CM | POA: Diagnosis not present

## 2024-02-15 ENCOUNTER — Ambulatory Visit: Payer: BC Managed Care – PPO | Admitting: Adult Health

## 2024-07-05 ENCOUNTER — Ambulatory Visit: Admitting: Adult Health
